# Patient Record
Sex: Male | Born: 1965 | Race: Black or African American | Hispanic: No | Marital: Single | Smoking: Current every day smoker
Health system: Southern US, Community
[De-identification: ages and names within clinical notes are randomized; demographics above are authoritative.]

## PROBLEM LIST (undated history)

## (undated) DIAGNOSIS — F17200 Nicotine dependence, unspecified, uncomplicated: Secondary | ICD-10-CM

## (undated) DIAGNOSIS — I712 Thoracic aortic aneurysm, without rupture, unspecified: Secondary | ICD-10-CM

## (undated) DIAGNOSIS — I10 Essential (primary) hypertension: Secondary | ICD-10-CM

## (undated) DIAGNOSIS — F1020 Alcohol dependence, uncomplicated: Secondary | ICD-10-CM

---

## 1998-10-14 ENCOUNTER — Emergency Department (HOSPITAL_COMMUNITY): Admission: EM | Admit: 1998-10-14 | Discharge: 1998-10-14 | Payer: Self-pay | Admitting: Emergency Medicine

## 1999-04-26 ENCOUNTER — Emergency Department (HOSPITAL_COMMUNITY): Admission: EM | Admit: 1999-04-26 | Discharge: 1999-04-26 | Payer: Self-pay | Admitting: Emergency Medicine

## 1999-07-10 ENCOUNTER — Emergency Department (HOSPITAL_COMMUNITY): Admission: EM | Admit: 1999-07-10 | Discharge: 1999-07-10 | Payer: Self-pay | Admitting: Internal Medicine

## 2001-11-28 ENCOUNTER — Emergency Department (HOSPITAL_COMMUNITY): Admission: EM | Admit: 2001-11-28 | Discharge: 2001-11-28 | Payer: Self-pay | Admitting: *Deleted

## 2002-08-13 ENCOUNTER — Emergency Department (HOSPITAL_COMMUNITY): Admission: EM | Admit: 2002-08-13 | Discharge: 2002-08-14 | Payer: Self-pay | Admitting: Emergency Medicine

## 2002-08-13 ENCOUNTER — Encounter: Payer: Self-pay | Admitting: Emergency Medicine

## 2002-12-21 ENCOUNTER — Emergency Department (HOSPITAL_COMMUNITY): Admission: EM | Admit: 2002-12-21 | Discharge: 2002-12-21 | Payer: Self-pay | Admitting: Emergency Medicine

## 2002-12-21 ENCOUNTER — Encounter: Payer: Self-pay | Admitting: Emergency Medicine

## 2003-12-25 ENCOUNTER — Emergency Department (HOSPITAL_COMMUNITY): Admission: EM | Admit: 2003-12-25 | Discharge: 2003-12-25 | Payer: Self-pay | Admitting: *Deleted

## 2004-03-01 ENCOUNTER — Emergency Department (HOSPITAL_COMMUNITY): Admission: EM | Admit: 2004-03-01 | Discharge: 2004-03-01 | Payer: Self-pay | Admitting: Emergency Medicine

## 2004-08-09 ENCOUNTER — Emergency Department (HOSPITAL_COMMUNITY): Admission: EM | Admit: 2004-08-09 | Discharge: 2004-08-09 | Payer: Self-pay | Admitting: Emergency Medicine

## 2005-01-30 ENCOUNTER — Emergency Department (HOSPITAL_COMMUNITY): Admission: EM | Admit: 2005-01-30 | Discharge: 2005-01-30 | Payer: Self-pay | Admitting: Emergency Medicine

## 2006-06-08 ENCOUNTER — Emergency Department (HOSPITAL_COMMUNITY): Admission: EM | Admit: 2006-06-08 | Discharge: 2006-06-08 | Payer: Self-pay | Admitting: Family Medicine

## 2008-03-09 ENCOUNTER — Emergency Department (HOSPITAL_COMMUNITY): Admission: EM | Admit: 2008-03-09 | Discharge: 2008-03-09 | Payer: Self-pay | Admitting: Family Medicine

## 2008-03-10 ENCOUNTER — Ambulatory Visit: Payer: Self-pay | Admitting: Emergency Medicine

## 2008-03-10 ENCOUNTER — Inpatient Hospital Stay (HOSPITAL_COMMUNITY): Admission: EM | Admit: 2008-03-10 | Discharge: 2008-03-11 | Payer: Self-pay | Admitting: Emergency Medicine

## 2008-09-28 ENCOUNTER — Emergency Department (HOSPITAL_COMMUNITY): Admission: EM | Admit: 2008-09-28 | Discharge: 2008-09-29 | Payer: Self-pay | Admitting: Emergency Medicine

## 2009-05-05 ENCOUNTER — Emergency Department (HOSPITAL_COMMUNITY): Admission: EM | Admit: 2009-05-05 | Discharge: 2009-05-05 | Payer: Self-pay | Admitting: Emergency Medicine

## 2009-09-17 ENCOUNTER — Emergency Department (HOSPITAL_COMMUNITY): Admission: EM | Admit: 2009-09-17 | Discharge: 2009-09-17 | Payer: Self-pay | Admitting: Emergency Medicine

## 2010-07-18 LAB — BRAIN NATRIURETIC PEPTIDE: Pro B Natriuretic peptide (BNP): 186 pg/mL — ABNORMAL HIGH (ref 0.0–100.0)

## 2010-07-18 LAB — DIFFERENTIAL
Basophils Absolute: 0 10*3/uL (ref 0.0–0.1)
Basophils Relative: 1 % (ref 0–1)
Eosinophils Absolute: 0 10*3/uL (ref 0.0–0.7)
Eosinophils Relative: 1 % (ref 0–5)
Lymphocytes Relative: 50 % — ABNORMAL HIGH (ref 12–46)
Lymphs Abs: 1.7 10*3/uL (ref 0.7–4.0)
Monocytes Absolute: 0.4 10*3/uL (ref 0.1–1.0)
Monocytes Relative: 13 % — ABNORMAL HIGH (ref 3–12)
Neutro Abs: 1.2 10*3/uL — ABNORMAL LOW (ref 1.7–7.7)
Neutrophils Relative %: 36 % — ABNORMAL LOW (ref 43–77)

## 2010-07-18 LAB — CBC
HCT: 37.5 % — ABNORMAL LOW (ref 39.0–52.0)
Hemoglobin: 13 g/dL (ref 13.0–17.0)
MCHC: 34.7 g/dL (ref 30.0–36.0)
MCV: 96.7 fL (ref 78.0–100.0)
Platelets: 240 10*3/uL (ref 150–400)
RBC: 3.88 MIL/uL — ABNORMAL LOW (ref 4.22–5.81)
RDW: 13.6 % (ref 11.5–15.5)
WBC: 3.5 10*3/uL — ABNORMAL LOW (ref 4.0–10.5)

## 2010-07-18 LAB — POCT CARDIAC MARKERS
CKMB, poc: 1.4 ng/mL (ref 1.0–8.0)
Myoglobin, poc: 80.5 ng/mL (ref 12–200)
Troponin i, poc: <0.05 ng/mL (ref 0.00–0.09)

## 2010-07-18 LAB — BASIC METABOLIC PANEL
BUN: 8 mg/dL (ref 6–23)
CO2: 24 mEq/L (ref 19–32)
Calcium: 9 mg/dL (ref 8.4–10.5)
Chloride: 104 mEq/L (ref 96–112)
Creatinine, Ser: 0.95 mg/dL (ref 0.4–1.5)
GFR calc Af Amer: >60 mL/min (ref >60)
GFR calc non Af Amer: >60 mL/min (ref >60)
Glucose, Bld: 90 mg/dL (ref 70–99)
Potassium: 3.8 mEq/L (ref 3.5–5.1)
Sodium: 135 mEq/L (ref 135–145)

## 2010-08-09 LAB — DIFFERENTIAL
Basophils Absolute: 0 10*3/uL (ref 0.0–0.1)
Basophils Relative: 0 % (ref 0–1)
Eosinophils Absolute: 0 10*3/uL (ref 0.0–0.7)
Eosinophils Relative: 0 % (ref 0–5)
Lymphocytes Relative: 26 % (ref 12–46)
Lymphs Abs: 1.2 10*3/uL (ref 0.7–4.0)
Monocytes Absolute: 0.4 10*3/uL (ref 0.1–1.0)
Monocytes Relative: 10 % (ref 3–12)
Neutro Abs: 3 10*3/uL (ref 1.7–7.7)
Neutrophils Relative %: 64 % (ref 43–77)

## 2010-08-09 LAB — COMPREHENSIVE METABOLIC PANEL
ALT: 20 U/L (ref 0–53)
AST: 30 U/L (ref 0–37)
Albumin: 4.4 g/dL (ref 3.5–5.2)
Alkaline Phosphatase: 57 U/L (ref 39–117)
BUN: 7 mg/dL (ref 6–23)
CO2: 28 mEq/L (ref 19–32)
Calcium: 9.7 mg/dL (ref 8.4–10.5)
Chloride: 101 mEq/L (ref 96–112)
Creatinine, Ser: 1.11 mg/dL (ref 0.4–1.5)
GFR calc Af Amer: >60 mL/min (ref >60)
GFR calc non Af Amer: >60 mL/min (ref >60)
Glucose, Bld: 101 mg/dL — ABNORMAL HIGH (ref 70–99)
Potassium: 3.5 mEq/L (ref 3.5–5.1)
Sodium: 138 mEq/L (ref 135–145)
Total Bilirubin: 0.7 mg/dL (ref 0.3–1.2)
Total Protein: 8.2 g/dL (ref 6.0–8.3)

## 2010-08-09 LAB — CBC
HCT: 41.2 % (ref 39.0–52.0)
Hemoglobin: 14.3 g/dL (ref 13.0–17.0)
MCHC: 34.6 g/dL (ref 30.0–36.0)
MCV: 97.2 fL (ref 78.0–100.0)
Platelets: 223 10*3/uL (ref 150–400)
RBC: 4.24 MIL/uL (ref 4.22–5.81)
RDW: 13.9 % (ref 11.5–15.5)
WBC: 4.6 10*3/uL (ref 4.0–10.5)

## 2010-08-09 LAB — ETHANOL: Alcohol, Ethyl (B): <5 mg/dL (ref 0–10)

## 2010-09-13 NOTE — H&P (Signed)
NAMESAVAUGHN, KARWOWSKI                ACCOUNT NO.:  [id number]   MEDICAL RECORD NO.:  [id number]          PATIENT TYPE:  INP   LOCATION:  2113                         FACILITY:  [facility]   PHYSICIAN:  Jefry H. Pollyann Kennedy, MD     DATE OF BIRTH:  Dec 20, 1965   DATE OF ADMISSION:  03/10/2008  DATE OF DISCHARGE:                              HISTORY & PHYSICAL   TIME:  5:00 a.m.   ADMISSION DIAGNOSIS:  Angioedema.   HISTORY:  This is a 45 year old with a history of having been prescribed  lisinopril yesterday at the [facility] at [facility].  He took  his first dose yesterday evening and awoke at 2 in the morning with  shortness of breath, difficulty swallowing secretions, and trouble  breathing.  He had trouble with his voice.  He called 911 and was  brought to the emergency department.  He was given antihistamine and H2  blocker and has had some improvement of symptoms.   He has no other significant past medical history.   His social history is positive for cigarette and alcohol use.   He is on no other medications.   PHYSICAL EXAMINATION:  Healthy-appearing gentleman, sitting upright in  the emergency room chair without any stridor or difficulty breathing  currently.  With forced inspiration and expiration, he is moving good  air movement without any stridor.  No palpable neck masses.  Oral cavity  and pharynx reveals edema of the left side of the soft palate and  pharyngeal wall.  His voice is clear.  He is handling secretions well.  There is no swelling of the tongue, floor of mouth or remainder of  oropharynx.   IMPRESSION:  Angiotensin-converting enzyme inhibitor angioedema, seems  to be resolving with initial medical treatment.   PLAN:  To admit to the intensive care unit for observation to make sure  that there is no advancement of the swelling and no additional airway  concerns.  We will continue on H2 and H1 blockers.  We will have him  followup as an outpatient to have  his hypertension treated with  different classes of medication.      Jefry H. Pollyann Kennedy, MD  Electronically Signed     JHR/MEDQ  D:  03/10/2008  T:  03/10/2008  Job:  [id number]

## 2011-01-31 LAB — BASIC METABOLIC PANEL
BUN: 10
BUN: 7
CO2: 26
CO2: 27
Calcium: 9.1
Calcium: 9.7
Chloride: 100
Chloride: 98
Creatinine, Ser: 1.04
Creatinine, Ser: 1.06
GFR calc Af Amer: >60
GFR calc Af Amer: >60
GFR calc non Af Amer: >60
GFR calc non Af Amer: >60
Glucose, Bld: 149 — ABNORMAL HIGH
Glucose, Bld: 152 — ABNORMAL HIGH
Potassium: 3.8
Potassium: 3.8
Sodium: 132 — ABNORMAL LOW
Sodium: 134 — ABNORMAL LOW

## 2011-01-31 LAB — GLUCOSE, CAPILLARY: Glucose-Capillary: 111 — ABNORMAL HIGH

## 2011-10-11 ENCOUNTER — Emergency Department (HOSPITAL_COMMUNITY): Payer: Medicaid Other

## 2011-10-11 ENCOUNTER — Encounter (HOSPITAL_COMMUNITY): Payer: Self-pay | Admitting: *Deleted

## 2011-10-11 ENCOUNTER — Encounter (HOSPITAL_COMMUNITY): Payer: Self-pay | Admitting: Emergency Medicine

## 2011-10-11 ENCOUNTER — Emergency Department (HOSPITAL_COMMUNITY)
Admission: EM | Admit: 2011-10-11 | Discharge: 2011-10-11 | Disposition: A | Discharge status: Home / Self Care | Payer: Medicaid Other | Attending: Emergency Medicine | Admitting: Emergency Medicine

## 2011-10-11 ENCOUNTER — Emergency Department (INDEPENDENT_AMBULATORY_CARE_PROVIDER_SITE_OTHER)
Admission: EM | Admit: 2011-10-11 | Discharge: 2011-10-11 | Disposition: A | Discharge status: Nursing Home (Includes ICF) | Payer: Medicaid Other | Source: Home / Self Care | Attending: Emergency Medicine | Admitting: Emergency Medicine

## 2011-10-11 DIAGNOSIS — R220 Localized swelling, mass and lump, head: Secondary | ICD-10-CM | POA: Insufficient documentation

## 2011-10-11 DIAGNOSIS — I10 Essential (primary) hypertension: Secondary | ICD-10-CM | POA: Insufficient documentation

## 2011-10-11 DIAGNOSIS — K090 Developmental odontogenic cysts: Secondary | ICD-10-CM | POA: Insufficient documentation

## 2011-10-11 DIAGNOSIS — R221 Localized swelling, mass and lump, neck: Secondary | ICD-10-CM | POA: Insufficient documentation

## 2011-10-11 DIAGNOSIS — L089 Local infection of the skin and subcutaneous tissue, unspecified: Secondary | ICD-10-CM

## 2011-10-11 DIAGNOSIS — B89 Unspecified parasitic disease: Secondary | ICD-10-CM

## 2011-10-11 HISTORY — DX: Essential (primary) hypertension: I10

## 2011-10-11 LAB — CBC
HCT: 41.4 % (ref 39.0–52.0)
Hemoglobin: 14.5 g/dL (ref 13.0–17.0)
MCH: 33 pg (ref 26.0–34.0)
MCHC: 35 g/dL (ref 30.0–36.0)
MCV: 94.1 fL (ref 78.0–100.0)
Platelets: 237 10*3/uL (ref 150–400)
RBC: 4.4 MIL/uL (ref 4.22–5.81)
RDW: 13.4 % (ref 11.5–15.5)
WBC: 6.8 10*3/uL (ref 4.0–10.5)

## 2011-10-11 LAB — BASIC METABOLIC PANEL
CO2: 21 mEq/L (ref 19–32)
Calcium: 10 mg/dL (ref 8.4–10.5)
GFR calc Af Amer: >90 mL/min (ref >90)
Sodium: 135 mEq/L (ref 135–145)

## 2011-10-11 LAB — DIFFERENTIAL
Basophils Absolute: 0 10*3/uL (ref 0.0–0.1)
Basophils Relative: 0 % (ref 0–1)
Eosinophils Absolute: 0 10*3/uL (ref 0.0–0.7)
Eosinophils Relative: 0 % (ref 0–5)
Lymphocytes Relative: 17 % (ref 12–46)
Lymphs Abs: 1.2 10*3/uL (ref 0.7–4.0)
Monocytes Absolute: 0.8 10*3/uL (ref 0.1–1.0)
Monocytes Relative: 11 % (ref 3–12)
Neutro Abs: 4.8 10*3/uL (ref 1.7–7.7)
Neutrophils Relative %: 72 % (ref 43–77)

## 2011-10-11 MED ORDER — OXYCODONE-ACETAMINOPHEN 5-325 MG PO TABS: 2.0000 | ORAL_TABLET | ORAL | Status: AC | PRN

## 2011-10-11 MED ORDER — IOHEXOL 300 MG/ML  SOLN
100.0000 mL | Freq: Once | INTRAMUSCULAR | Status: AC | PRN
  Administered 2011-10-11: 100 mL via INTRAVENOUS

## 2011-10-11 MED ORDER — AMOXICILLIN 500 MG PO CAPS: 500.0000 mg | ORAL_CAPSULE | Freq: Three times a day (TID) | ORAL | Status: AC

## 2011-10-11 MED ORDER — OXYCODONE-ACETAMINOPHEN 5-325 MG PO TABS
2.0000 | ORAL_TABLET | Freq: Once | ORAL | Status: AC
  Administered 2011-10-11: 2 via ORAL
  Filled 2011-10-11: qty 2

## 2011-10-11 MED ORDER — OXYCODONE-ACETAMINOPHEN 5-325 MG PO TABS: 2.0000 | ORAL_TABLET | ORAL | Status: DC | PRN

## 2011-10-11 MED ORDER — PENICILLIN V POTASSIUM 250 MG PO TABS
500.0000 mg | ORAL_TABLET | Freq: Once | ORAL | Status: AC
  Administered 2011-10-11: 500 mg via ORAL
  Filled 2011-10-11: qty 2

## 2011-10-11 MED ORDER — ONDANSETRON HCL 4 MG/2ML IJ SOLN
4.0000 mg | Freq: Once | INTRAMUSCULAR | Status: AC
  Administered 2011-10-11: 4 mg via INTRAVENOUS
  Filled 2011-10-11: qty 2

## 2011-10-11 MED ORDER — MORPHINE SULFATE 4 MG/ML IJ SOLN
4.0000 mg | Freq: Once | INTRAMUSCULAR | Status: AC
  Administered 2011-10-11: 4 mg via INTRAVENOUS
  Filled 2011-10-11: qty 1

## 2011-10-11 NOTE — Discharge Instructions (Signed)
Dental Abscess Take antibiotics and pain medications as prescribed. Call Dr. Frederik Pear office first thing in the morning to get seen as soon as possible.  Return to the ED if you develop new or worsening symptoms. A dental abscess usually starts from an infected tooth. Antibiotic medicine and pain pills can be helpful, but dental infections require the attention of a dentist. Rinse around the infected area often with salt water (a pinch of salt in 8 oz of warm water). Do not apply heat to the outside of your face. See your dentist or oral surgeon as soon as possible.  SEEK IMMEDIATE MEDICAL CARE IF:  You have increasing, severe pain that is not relieved by medicine.   You or your child has an oral temperature above 102 F (38.9 C), not controlled by medicine.   Your baby is older than 3 months with a rectal temperature of 102 F (38.9 C) or higher.   Your baby is 67 months old or younger with a rectal temperature of 100.4 F (38 C) or higher.   You develop chills, severe headache, difficulty breathing, or trouble swallowing.   You have swelling in the neck or around the eye.  Document Released: 04/17/2005 Document Revised: 04/06/2011 Document Reviewed: 09/26/2006 [facility] Patient Information 2012 [place], [place].

## 2011-10-11 NOTE — ED Provider Notes (Signed)
History   This chart was scribed for Glynn Octave, MD by Charolett Bumpers . The patient was seen in room STRE1/STRE1.    CSN: [id number]  Arrival date & time 10/11/11  1435   First MD Initiated Contact with Patient 10/11/11 1623      Chief Complaint  Patient presents with  . Facial Swelling    (Consider location/radiation/quality/duration/timing/severity/associated sxs/prior treatment) HPI Chad Salas is a 46 y.o. male who presents to the Emergency Department complaining of constant, moderate facial swelling for the past 3 days on the left side all the way down to lip and nose. Patient reports associated pain but denies any dental pain. Patient denies any fevers. Patient reports associated nausea and vomiting just PTA. Patient was sent from urgent care for CT scan. Patient reports a h/o HTN.    Past Medical History  Diagnosis Date  . Hypertension     History reviewed. No pertinent past surgical history.  History reviewed. No pertinent family history.  History  Substance Use Topics  . Smoking status: Current Everyday Smoker  . Smokeless tobacco: Not on file  . Alcohol Use: Yes     social      Review of Systems  Constitutional: Negative for fever and chills.  HENT: Positive for facial swelling. Negative for neck pain.   Respiratory: Negative for shortness of breath.   Cardiovascular: Negative for chest pain.  Gastrointestinal: Negative for nausea, vomiting and abdominal pain.  Neurological: Negative for weakness.  All other systems reviewed and are negative.    Allergies  Lisinopril  Home Medications   Current Outpatient Rx  Name Route Sig Dispense Refill  . ACETAMINOPHEN 500 MG PO TABS Oral Take 1,000 mg by mouth every 4 (four) hours as needed. For pain    . GOODY HEADACHE PO Oral Take 1 Package by mouth as needed. For pain    . HYDROCHLOROTHIAZIDE 12.5 MG PO CAPS Oral Take 12.5 mg by mouth daily.       BP 134/88  Pulse 80  Temp 100.1 F  (37.8 C) (Oral)  Resp 18  SpO2 100%  Physical Exam  Nursing note and vitals reviewed. Constitutional: He is oriented to person, place, and time. He appears well-developed and well-nourished. No distress.  HENT:  Head: Normocephalic and atraumatic.  Nose: No septal deviation or nasal septal hematoma.       No septal hematoma. Septum appears normal. TM's normal bilaterally. Left sided facial swelling. Left side of pallet, tender area of swelling with induration.   Eyes: EOM are normal.  Neck: Neck supple. No tracheal deviation present.  Cardiovascular: Normal rate and intact distal pulses.   Pulmonary/Chest: Effort normal. No respiratory distress.  Musculoskeletal: Normal range of motion.  Neurological: He is alert and oriented to person, place, and time.  Skin: Skin is warm and dry.  Psychiatric: He has a normal mood and affect. His behavior is normal.    ED Course  Procedures (including critical care time)  DIAGNOSTIC STUDIES: Oxygen Saturation is 99% on room air, normal by my interpretation.    COORDINATION OF CARE:  1630: Discussed planned course of treatment with the patient, who is agreeable at this time.  1900: Recheck: Informed patient of imaging and lab results. Discussed f/u.   Results for orders placed during the hospital encounter of 10/11/11  CBC      Component Value Range   WBC 6.8  4.0 - 10.5 K/uL   RBC 4.40  4.22 - 5.81 MIL/uL  Hemoglobin 14.5  13.0 - 17.0 g/dL   HCT 16.1  09.6 - 04.5 %   MCV 94.1  78.0 - 100.0 fL   MCH 33.0  26.0 - 34.0 pg   MCHC 35.0  30.0 - 36.0 g/dL   RDW 40.9  81.1 - 91.4 %   Platelets 237  150 - 400 K/uL  DIFFERENTIAL      Component Value Range   Neutrophils Relative 72  43 - 77 %   Neutro Abs 4.8  1.7 - 7.7 K/uL   Lymphocytes Relative 17  12 - 46 %   Lymphs Abs 1.2  0.7 - 4.0 K/uL   Monocytes Relative 11  3 - 12 %   Monocytes Absolute 0.8  0.1 - 1.0 K/uL   Eosinophils Relative 0  0 - 5 %   Eosinophils Absolute 0.0  0.0 - 0.7  K/uL   Basophils Relative 0  0 - 1 %   Basophils Absolute 0.0  0.0 - 0.1 K/uL  BASIC METABOLIC PANEL      Component Value Range   Sodium 135  135 - 145 mEq/L   Potassium 4.7  3.5 - 5.1 mEq/L   Chloride 99  96 - 112 mEq/L   CO2 21  19 - 32 mEq/L   Glucose, Bld 81  70 - 99 mg/dL   BUN 9  6 - 23 mg/dL   Creatinine, Ser 7.82  0.50 - 1.35 mg/dL   Calcium 95.6  8.4 - 21.3 mg/dL   GFR calc non Af Amer >90  >90 mL/min   GFR calc Af Amer >90  >90 mL/min     Ct Maxillofacial W/cm  10/11/2011  *RADIOLOGY REPORT*  Clinical Data: Facial Swelling for 3 days.  CT MAXILLOFACIAL WITH CONTRAST  Technique:  Multidetector CT imaging of the maxillofacial structures was performed with intravenous contrast. Multiplanar CT image reconstructions were also generated.  Contrast: OMNIPAQUE IOHEXOL 300 MG/ML  SOLN  Comparison: None.  Findings: There is a 23 x 29 x 26 mm left paramedian maxillary lesion which has eroded at this surrounding bone with some areas of eggshell like residual bony density.  The lesion has extended into the floor of the left maxillary sinus where there is moderate mucosal thickening.  The lesion appears to have arisen from the roots of the 9th and or 10th teeth, left maxillary incisors. There is minimal periapical disease of tooth 11 (left maxillary canine), but the 2 cm lesion does not appear to represent a periapical abscess. 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 Superinfection of this likely a dentigerous cyst cannot be excluded.  The mandible is intact.  Located TMJs.  Moderate sinus disease in the posterior ethmoids, greater on the left.  Frontal sinuses and sphenoid sinus clear.  IMPRESSION: 23 x 29 x 26 mm left paramedian maxillary lesion which has smoothly eroded the surrounding maxilla with some areas of eggshell like residual bone.  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 Superimposed infection of this cyst is not excluded.  Periapical abscess felt unlikely.   Original Report Authenticated By: Elsie Stain, M.D.     No diagnosis found.    MDM  Sent from urgent care with left-sided facial swelling and palate swelling for the past 2 days. No fevers, nausea or vomiting, difficulty breathing or swallowing.  No associated dental pain.  CT results discussed with Dr. Chales Salmon. Recommends antibiotics, pain medication, followup in office tomorrow. Patient instructed to call office first thing in  morning.  I personally performed the services described in this documentation, which was scribed in my presence.  The recorded information has been reviewed and considered.       Glynn Octave, MD 10/11/11 [id number]

## 2011-10-11 NOTE — ED Notes (Signed)
Pt reports that he started to have facial pain and lip swelling for the past two days. No relief from otc meds

## 2011-10-11 NOTE — ED Notes (Signed)
Pt here from [facility] with left sided facial swelling and possible abscess tooth x 4 days; pt sent here for possible CT scan

## 2011-10-11 NOTE — ED Provider Notes (Addendum)
History     CSN: [id number]  Arrival date & time 10/11/11  1253   First MD Initiated Contact with Patient 10/11/11 1346      Chief Complaint  Patient presents with  . Oral Swelling  . Facial Pain    (Consider location/radiation/quality/duration/timing/severity/associated sxs/prior treatment) HPI Comments: Patient presents urgent care reporting that the left side of his face all the way down to his lip and his nose. " its  hurting for the last 2-3 days. Patient denies any dental pain but admits of having dental problems. He denies any fevers, or feeling nauseous. Patient is describing a headache that extends to his  left for headhead, throbbing pain.  The history is provided by the patient.    Past Medical History  Diagnosis Date  . Hypertension     History reviewed. No pertinent past surgical history.  History reviewed. No pertinent family history.  History  Substance Use Topics  . Smoking status: Current Everyday Smoker  . Smokeless tobacco: Not on file  . Alcohol Use: Yes     social      Review of Systems  Constitutional: Positive for chills, appetite change and fatigue. Negative for fever, diaphoresis and unexpected weight change.  Musculoskeletal: Negative for myalgias, joint swelling and arthralgias.  Neurological: Positive for headaches. Negative for dizziness and light-headedness.    Allergies  Lisinopril  Home Medications   Current Outpatient Rx  Name Route Sig Dispense Refill  . HYDROCHLOROTHIAZIDE 12.5 MG PO CAPS Oral Take 12.5 mg by mouth daily.     . ACETAMINOPHEN 500 MG PO TABS Oral Take 1,000 mg by mouth every 4 (four) hours as needed. For pain    . GOODY HEADACHE PO Oral Take 1 Package by mouth as needed. For pain      BP 140/101  Pulse 76  Temp 99.1 F (37.3 C) (Oral)  Resp 19  SpO2 99%  Physical Exam  Nursing note and vitals reviewed. Constitutional: He is oriented to person, place, and time. He appears well-developed. He appears  distressed.  HENT:  Head:    Mouth/Throat: Uvula is midline.    Neck: Neck supple.  Musculoskeletal: He exhibits no tenderness.  Neurological: He is alert and oriented to person, place, and time.  Skin: No erythema.    ED Course  Procedures (including critical care time)  Labs Reviewed - No data to display No results found.   1. Facial infection       MDM  Patient with an extensive hard palate abnormality consistent with a regional infection possibly of dental origin. Patient has been transferred to the emergency department as it's a candidate for further imaging and characterization of this infection. Patient is afebrile.        Jimmie Molly, MD 10/11/11 1433  Jimmie Molly, MD 10/11/11 [phone]

## 2011-10-11 NOTE — ED Notes (Signed)
Pt reports having (L) sided facial swelling and pain x 4 days.  States that he has had this in the past but this time it is more painful and did not go away.  Pt denies injury.  Swelling noted to (L) side of cheek and jaw.  pts gums swollen and tender.  Pt reports increased pain with eating.  No acute distress noted.

## 2011-10-11 NOTE — ED Notes (Signed)
Report called to first nurse Morrie Sheldon, RN pt escorted to ed via shuttle

## 2013-04-13 ENCOUNTER — Encounter (HOSPITAL_COMMUNITY): Payer: Self-pay | Admitting: Emergency Medicine

## 2013-04-13 ENCOUNTER — Emergency Department (HOSPITAL_COMMUNITY)
Admission: EM | Admit: 2013-04-13 | Discharge: 2013-04-13 | Disposition: A | Discharge status: Home / Self Care | Payer: Medicaid Other | Attending: Emergency Medicine | Admitting: Emergency Medicine

## 2013-04-13 DIAGNOSIS — T783XXA Angioneurotic edema, initial encounter: Secondary | ICD-10-CM

## 2013-04-13 DIAGNOSIS — I10 Essential (primary) hypertension: Secondary | ICD-10-CM | POA: Insufficient documentation

## 2013-04-13 DIAGNOSIS — F17200 Nicotine dependence, unspecified, uncomplicated: Secondary | ICD-10-CM | POA: Insufficient documentation

## 2013-04-13 MED ORDER — DIPHENHYDRAMINE HCL 25 MG PO TABS: 50.0000 mg | ORAL_TABLET | Freq: Four times a day (QID) | ORAL | Status: DC

## 2013-04-13 MED ORDER — PREDNISONE 10 MG PO TABS: 60.0000 mg | ORAL_TABLET | Freq: Every day | ORAL | Status: DC

## 2013-04-13 NOTE — ED Notes (Signed)
Pt resting in bed talking in clear complete sentences.  No wants or need at the time per pt

## 2013-04-13 NOTE — ED Notes (Signed)
Pt states he had some burning in his lips earlier tonight, but medications given to him by EMS have reduced his SOB and pain in lips.  Pt states he feels "prettu much back to normal" at this time, but is concern about why this is happening.  Pt states he has been dealing with it for a week, but treating it with PO benadryl at home.

## 2013-04-13 NOTE — ED Notes (Signed)
Presents with angioedema to lips, hives, wheezing, began at midnight. Hx of angioedema with lisinopril, does not currently take. Reports having some mild itching at night for the last few nights but it goes away in the morning. EMS gave 50 of zantac, 125 solumedrol, 4 of zofran, 50 of benadryl, 200 NS, 0.5mg  of albuterol. With decrease to facial edema. On nebulizer at the time, respirations unlabored.

## 2013-04-13 NOTE — ED Provider Notes (Signed)
CSN: [id number]     Arrival date & time 04/13/13  0143 History   First MD Initiated Contact with Patient 04/13/13 0148     Chief Complaint  Patient presents with  . Allergic Reaction    The history is provided by the patient.   Patient reports a history of angioedema.  He previously was on lisinopril but is no longer on lisinopril.  He is brought to the emergency department because he awoke with swelling of both his upper lower lip.  He was brought to the emergency room by EMS.  He was given 50 mg of Zantac, 125 mg of Solu-Medrol, 4 mg of Zofran, 50 mg of Benadryl.  Patient and EMS reported improvement in his facial swelling on arrival.  He does intermittently take ibuprofen and Tylenol.  He did take some ibuprofen this evening.  Patient states his breathing feels much better at this time.  No difficulty swallowing or speaking.  He feels as though his face has improved.  Past Medical History  Diagnosis Date  . Hypertension    History reviewed. No pertinent past surgical history. History reviewed. No pertinent family history. History  Substance Use Topics  . Smoking status: Current Every Day Smoker  . Smokeless tobacco: Not on file  . Alcohol Use: Yes     Comment: social    Review of Systems  All other systems reviewed and are negative.    Allergies  Lisinopril  Home Medications   Current Outpatient Rx  Name  Route  Sig  Dispense  Refill  . ibuprofen (ADVIL,MOTRIN) 200 MG tablet   Oral   Take 400 mg by mouth every 6 (six) hours as needed for fever, headache or mild pain.         Marland Kitchen albuterol (PROVENTIL) (5 MG/ML) 0.5% nebulizer solution   Nebulization   Take 2.5 mg by nebulization once.         . diphenhydrAMINE (BENADRYL) 25 MG tablet   Oral   Take 2 tablets (50 mg total) by mouth every 6 (six) hours.   20 tablet   0   . diphenhydrAMINE (BENADRYL) 50 MG/ML injection   Intravenous   Inject 50 mg into the vein.         . methylPREDNISolone sodium succinate  (SOLU-MEDROL) 125 mg/2 mL injection   Intravenous   Inject 125 mg into the vein once.         . ondansetron (ZOFRAN) 4 MG/2ML SOLN injection   Intravenous   Inject 4 mg into the vein once.         . predniSONE (DELTASONE) 10 MG tablet   Oral   Take 6 tablets (60 mg total) by mouth daily.   30 tablet   0    BP 127/88  Pulse 95  Temp(Src) 98.2 F (36.8 C) (Oral)  Resp 16  SpO2 99% Physical Exam  Nursing note and vitals reviewed. Constitutional: He is oriented to person, place, and time. He appears well-developed and well-nourished.  HENT:  Head: Normocephalic and atraumatic.  Welling about his upper and his lower lip.  Lower lip seems a little bit more swollen on the left side.  Tongue is normal in size.  Uvula is midline.  Oral airway is patent.  No stridor.  Tolerating secretions.  No difficulty with phonation.  Eyes: EOM are normal.  Neck: Normal range of motion.  Cardiovascular: Normal rate, regular rhythm, normal heart sounds and intact distal pulses.   Pulmonary/Chest: Effort normal and breath  sounds normal. No respiratory distress.  Abdominal: Soft. He exhibits no distension. There is no tenderness.  Musculoskeletal: Normal range of motion.  Neurological: He is alert and oriented to person, place, and time.  Skin: Skin is warm and dry.  Psychiatric: He has a normal mood and affect. Judgment normal.    ED Course  Procedures (including critical care time) Labs Review Labs Reviewed - No data to display Imaging Review No results found.  EKG Interpretation   None       MDM   1. Angioedema of lips, initial encounter    Angioedema.  It seems that this was resolving on arrival to the emergency department.  Patient was monitored in the emergency department throughout the night and has demonstrated stability and continues to have some improvement in the swelling of his lips.  Discharge home in good condition.  No indication for admission or additional treatment  at this time.    Lyanne Co, MD 04/13/13 [phone]

## 2015-02-01 ENCOUNTER — Encounter (HOSPITAL_COMMUNITY): Payer: Self-pay | Admitting: *Deleted

## 2015-02-01 ENCOUNTER — Emergency Department (HOSPITAL_COMMUNITY)
Admission: EM | Admit: 2015-02-01 | Discharge: 2015-02-01 | Disposition: A | Discharge status: Home / Self Care | Payer: Medicaid Other | Attending: Emergency Medicine | Admitting: Emergency Medicine

## 2015-02-01 DIAGNOSIS — Z79899 Other long term (current) drug therapy: Secondary | ICD-10-CM | POA: Diagnosis not present

## 2015-02-01 DIAGNOSIS — Z720 Tobacco use: Secondary | ICD-10-CM | POA: Diagnosis not present

## 2015-02-01 DIAGNOSIS — I10 Essential (primary) hypertension: Secondary | ICD-10-CM | POA: Diagnosis not present

## 2015-02-01 DIAGNOSIS — Z7952 Long term (current) use of systemic steroids: Secondary | ICD-10-CM | POA: Insufficient documentation

## 2015-02-01 DIAGNOSIS — Z760 Encounter for issue of repeat prescription: Secondary | ICD-10-CM | POA: Diagnosis not present

## 2015-02-01 MED ORDER — HYDROCHLOROTHIAZIDE 12.5 MG PO TABS: 12.5000 mg | ORAL_TABLET | Freq: Every day | ORAL | Status: DC

## 2015-02-01 NOTE — ED Notes (Signed)
Information  From social worker :  Name of MD on Pt. Medicade card is  [employer]  [address] [place].  [address]  Tele # [phone]

## 2015-02-01 NOTE — ED Notes (Addendum)
To ED for med refill of HCTZ. States he's been out of this medicine for past few years. States he doesn't have a pcp. States he has had intermittent HA's. Denies dizziness, sob, or cp. Denies n/v/d or difficulty with urination. States he is only here for a prescription and denies HA at this time

## 2015-02-01 NOTE — Discharge Instructions (Signed)
Take your blood pressure medication as directed. Eat a low-salt diet as outlined below. Follow up with your regular doctor in 1 week for ongoing management of your blood pressure. Return to the ER for changes or worsening symptoms.   Hypertension Hypertension, commonly called high blood pressure, is when the force of blood pumping through your arteries is too strong. Your arteries are the blood vessels that carry blood from your heart throughout your body. A blood pressure reading consists of a higher number over a lower number, such as 110/72. The higher number (systolic) is the pressure inside your arteries when your heart pumps. The lower number (diastolic) is the pressure inside your arteries when your heart relaxes. Ideally you want your blood pressure below 120/80. Hypertension forces your heart to work harder to pump blood. Your arteries may become narrow or stiff. Having hypertension puts you at risk for heart disease, stroke, and other problems.  RISK FACTORS Some risk factors for high blood pressure are controllable. Others are not.  Risk factors you cannot control include:   Race. You may be at higher risk if you are African American.  Age. Risk increases with age.  Gender. Men are at higher risk than women before age 73 years. After age 52, women are at higher risk than men. Risk factors you can control include:  Not getting enough exercise or physical activity.  Being overweight.  Getting too much fat, sugar, calories, or salt in your diet.  Drinking too much alcohol. SIGNS AND SYMPTOMS Hypertension does not usually cause signs or symptoms. Extremely high blood pressure (hypertensive crisis) may cause headache, anxiety, shortness of breath, and nosebleed. DIAGNOSIS  To check if you have hypertension, your health care provider will measure your blood pressure while you are seated, with your arm held at the level of your heart. It should be measured at least twice using the same  arm. Certain conditions can cause a difference in blood pressure between your right and left arms. A blood pressure reading that is higher than normal on one occasion does not mean that you need treatment. If one blood pressure reading is high, ask your health care provider about having it checked again. TREATMENT  Treating high blood pressure includes making lifestyle changes and possibly taking medicine. Living a healthy lifestyle can help lower high blood pressure. You may need to change some of your habits. Lifestyle changes may include:  Following the DASH diet. This diet is high in fruits, vegetables, and whole grains. It is low in salt, red meat, and added sugars.  Getting at least 2 hours of brisk physical activity every week.  Losing weight if necessary.  Not smoking.  Limiting alcoholic beverages.  Learning ways to reduce stress. If lifestyle changes are not enough to get your blood pressure under control, your health care provider may prescribe medicine. You may need to take more than one. Work closely with your health care provider to understand the risks and benefits. HOME CARE INSTRUCTIONS  Have your blood pressure rechecked as directed by your health care provider.   Take medicines only as directed by your health care provider. Follow the directions carefully. Blood pressure medicines must be taken as prescribed. The medicine does not work as well when you skip doses. Skipping doses also puts you at risk for problems.   Do not smoke.   Monitor your blood pressure at home as directed by your health care provider. SEEK MEDICAL CARE IF:   You think you are  having a reaction to medicines taken.  You have recurrent headaches or feel dizzy.  You have swelling in your ankles.  You have trouble with your vision. SEEK IMMEDIATE MEDICAL CARE IF:  You develop a severe headache or confusion.  You have unusual weakness, numbness, or feel faint.  You have severe chest  or abdominal pain.  You vomit repeatedly.  You have trouble breathing. MAKE SURE YOU:   Understand these instructions.  Will watch your condition.  Will get help right away if you are not doing well or get worse. Document Released: 04/17/2005 Document Revised: 09/01/2013 Document Reviewed: 02/07/2013 [facility] Patient Information 2015 [place], [place]. This information is not intended to replace advice given to you by your health care provider. Make sure you discuss any questions you have with your health care provider.  How to Take Your Blood Pressure HOW DO I GET A BLOOD PRESSURE MACHINE?  You can buy an electronic home blood pressure machine at your local pharmacy. Insurance will sometimes cover the cost if you have a prescription.  Ask your doctor what type of machine is best for you. There are different machines for your arm and your wrist.  If you decide to buy a machine to check your blood pressure on your arm, first check the size of your arm so you can buy the right size cuff. To check the size of your arm:   Use a measuring tape that shows both inches and centimeters.   Wrap the measuring tape around the upper-middle part of your arm. You may need someone to help you measure.   Write down your arm measurement in both inches and centimeters.   To measure your blood pressure correctly, it is important to have the right size cuff.   If your arm is up to 13 inches (up to 34 centimeters), get an adult cuff size.  If your arm is 13 to 17 inches (35 to 44 centimeters), get a large adult cuff size.    If your arm is 17 to 20 inches (45 to 52 centimeters), get an adult thigh cuff.  WHAT DO THE NUMBERS MEAN?   There are two numbers that make up your blood pressure. For example: 120/80.  The first number (120 in our example) is called the "systolic pressure." It is a measure of the pressure in your blood vessels when your heart is pumping blood.  The second number (80  in our example) is called the "diastolic pressure." It is a measure of the pressure in your blood vessels when your heart is resting between beats.  Your doctor will tell you what your blood pressure should be. WHAT SHOULD I DO BEFORE I CHECK MY BLOOD PRESSURE?   Try to rest or relax for at least 30 minutes before you check your blood pressure.  Do not smoke.  Do not have any drinks with caffeine, such as:  Soda.  Coffee.  Tea.  Check your blood pressure in a quiet room.  Sit down and stretch out your arm on a table. Keep your arm at about the level of your heart. Let your arm relax.  Make sure that your legs are not crossed. HOW DO I CHECK MY BLOOD PRESSURE?  Follow the directions that came with your machine.  Make sure you remove any tight-fighting clothing from your arm or wrist. Wrap the cuff around your upper arm or wrist. You should be able to fit a finger between the cuff and your arm. If you  cannot fit a finger between the cuff and your arm, it is too tight and should be removed and rewrapped.  Some units require you to manually pump up the arm cuff.  Automatic units inflate the cuff when you press a button.  Cuff deflation is automatic in both models.  After the cuff is inflated, the unit measures your blood pressure and pulse. The readings are shown on a monitor. Hold still and breathe normally while the cuff is inflated.  Getting a reading takes less than a minute.  Some models store readings in a memory. Some provide a printout of readings. If your machine does not store your readings, keep a written record.  Take readings with you to your next visit with your doctor. Document Released: 03/30/2008 Document Revised: 09/01/2013 Document Reviewed: 06/12/2013 [facility] Patient Information 2015 [place], [place]. This information is not intended to replace advice given to you by your health care provider. Make sure you discuss any questions you have with your health care  provider.  DASH Eating Plan DASH stands for "Dietary Approaches to Stop Hypertension." The DASH eating plan is a healthy eating plan that has been shown to reduce high blood pressure (hypertension). Additional health benefits may include reducing the risk of type 2 diabetes mellitus, heart disease, and stroke. The DASH eating plan may also help with weight loss. WHAT DO I NEED TO KNOW ABOUT THE DASH EATING PLAN? For the DASH eating plan, you will follow these general guidelines:  Choose foods with a percent daily value for sodium of less than 5% (as listed on the food label).  Use salt-free seasonings or herbs instead of table salt or sea salt.  Check with your health care provider or pharmacist before using salt substitutes.  Eat lower-sodium products, often labeled as "lower sodium" or "no salt added."  Eat fresh foods.  Eat more vegetables, fruits, and low-fat dairy products.  Choose whole grains. Look for the word "whole" as the first word in the ingredient list.  Choose fish and skinless chicken or [place] more often than red meat. Limit fish, poultry, and meat to 6 oz (170 g) each day.  Limit sweets, desserts, sugars, and sugary drinks.  Choose heart-healthy fats.  Limit cheese to 1 oz (28 g) per day.  Eat more home-cooked food and less restaurant, buffet, and fast food.  Limit fried foods.  Cook foods using methods other than frying.  Limit canned vegetables. If you do use them, rinse them well to decrease the sodium.  When eating at a restaurant, ask that your food be prepared with less salt, or no salt if possible. WHAT FOODS CAN I EAT? Seek help from a dietitian for individual calorie needs. Grains Whole grain or whole wheat bread. Brown rice. Whole grain or whole wheat pasta. Quinoa, bulgur, and whole grain cereals. Low-sodium cereals. Corn or whole wheat flour tortillas. Whole grain cornbread. Whole grain crackers. Low-sodium crackers. Vegetables Fresh or frozen  vegetables (raw, steamed, roasted, or grilled). Low-sodium or reduced-sodium tomato and vegetable juices. Low-sodium or reduced-sodium tomato sauce and paste. Low-sodium or reduced-sodium canned vegetables.  Fruits All fresh, canned (in natural juice), or frozen fruits. Meat and Other Protein Products Ground beef (85% or leaner), grass-fed beef, or beef trimmed of fat. Skinless chicken or [place]. Ground chicken or [place]. Pork trimmed of fat. All fish and seafood. Eggs. Dried beans, peas, or lentils. Unsalted nuts and seeds. Unsalted canned beans. Dairy Low-fat dairy products, such as skim or 1% milk, 2% or  reduced-fat cheeses, low-fat ricotta or cottage cheese, or plain low-fat yogurt. Low-sodium or reduced-sodium cheeses. Fats and Oils Tub margarines without trans fats. Light or reduced-fat mayonnaise and salad dressings (reduced sodium). Avocado. Safflower, olive, or canola oils. Natural peanut or almond butter. Other Unsalted popcorn and pretzels. The items listed above may not be a complete list of recommended foods or beverages. Contact your dietitian for more options. WHAT FOODS ARE NOT RECOMMENDED? Grains White bread. White pasta. White rice. Refined cornbread. Bagels and croissants. Crackers that contain trans fat. Vegetables Creamed or fried vegetables. Vegetables in a cheese sauce. Regular canned vegetables. Regular canned tomato sauce and paste. Regular tomato and vegetable juices. Fruits Dried fruits. Canned fruit in light or heavy syrup. Fruit juice. Meat and Other Protein Products Fatty cuts of meat. Ribs, chicken wings, bacon, sausage, bologna, salami, chitterlings, fatback, hot dogs, bratwurst, and packaged luncheon meats. Salted nuts and seeds. Canned beans with salt. Dairy Whole or 2% milk, cream, half-and-half, and cream cheese. Whole-fat or sweetened yogurt. Full-fat cheeses or blue cheese. Nondairy creamers and whipped toppings. Processed cheese, cheese spreads, or cheese  curds. Condiments Onion and garlic salt, seasoned salt, table salt, and sea salt. Canned and packaged gravies. Worcestershire sauce. Tartar sauce. Barbecue sauce. Teriyaki sauce. Soy sauce, including reduced sodium. Steak sauce. Fish sauce. Oyster sauce. Cocktail sauce. Horseradish. Ketchup and mustard. Meat flavorings and tenderizers. Bouillon cubes. Hot sauce. Tabasco sauce. Marinades. Taco seasonings. Relishes. Fats and Oils Butter, stick margarine, lard, shortening, ghee, and bacon fat. Coconut, palm kernel, or palm oils. Regular salad dressings. Other Pickles and olives. Salted popcorn and pretzels. The items listed above may not be a complete list of foods and beverages to avoid. Contact your dietitian for more information. WHERE CAN I FIND MORE INFORMATION? National [employer], [employer], and [employer] Institute: [employer] Document Released: 04/06/2011 Document Revised: 09/01/2013 Document Reviewed: 02/19/2013 [facility] Patient Information 2015 [place], [place]. This information is not intended to replace advice given to you by your health care provider. Make sure you discuss any questions you have with your health care provider.

## 2015-02-01 NOTE — ED Provider Notes (Signed)
CSN: [id number]     Arrival date & time 02/01/15  4098 History   By signing my name below, I, Marica Otter, attest that this documentation has been prepared under the direction and in the presence of Ellyanna Holton Camprubi-Soms, PA-C. Electronically Signed: Marica Otter, ED Scribe. 02/01/2015. 11:07 AM.  Chief Complaint  Patient presents with  . Hypertension   Patient is a 49 y.o. male presenting with hypertension. The history is provided by the patient. No language interpreter was used.  Hypertension This is a chronic problem. The current episode started more than 1 week ago. The problem occurs constantly. The problem has not changed since onset.Pertinent negatives include no chest pain, no abdominal pain, no headaches and no shortness of breath. Nothing aggravates the symptoms. The symptoms are relieved by medications. He has tried nothing for the symptoms. The treatment provided no relief.   PCP: [facility] Primary HPI Comments: Chad Salas is a 49 y.o. male, with Hx of HTN and daily tobacco use, who presents to the Emergency Department for refill of HCTZ 12.5mg ; pt notes he has been out of said meds for multiple years; during triage pt's BP is 140/105. Pt denies any other associated Sx at this time. Says he sometimes gets headaches intermittently, but doesn't have one today. Pt denies fever, chills, chest pain, SOB, n/v/d/c, abd pain, dysuria, hematuria, numbness/tingling, weakness, dizziness, headache, visual disturbances. Hasn't seen his PCP for this but will call today.  Past Medical History  Diagnosis Date  . Hypertension    History reviewed. No pertinent past surgical history. History reviewed. No pertinent family history. Social History  Substance Use Topics  . Smoking status: Current Every Day Smoker  . Smokeless tobacco: None  . Alcohol Use: Yes     Comment: social    Review of Systems  Constitutional: Negative for fever and chills.       +HTN  Eyes: Negative for  visual disturbance.  Respiratory: Negative for shortness of breath.   Cardiovascular: Negative for chest pain.  Gastrointestinal: Negative for nausea, vomiting, abdominal pain, diarrhea and constipation.  Genitourinary: Negative for dysuria, hematuria and difficulty urinating.  Musculoskeletal: Negative for myalgias and arthralgias.  Skin: Negative for color change.  Allergic/Immunologic: Negative for immunocompromised state.  Neurological: Negative for dizziness, weakness, numbness and headaches.  Psychiatric/Behavioral: Negative for confusion.   10 Systems reviewed and all are negative for acute change except as noted in the HPI.  Allergies  Lisinopril  Home Medications   Prior to Admission medications   Medication Sig Start Date End Date Taking? Authorizing Provider  albuterol (PROVENTIL) (5 MG/ML) 0.5% nebulizer solution Take 2.5 mg by nebulization once.    Historical Provider, MD  diphenhydrAMINE (BENADRYL) 25 MG tablet Take 2 tablets (50 mg total) by mouth every 6 (six) hours. 04/13/13 04/16/13  Azalia Bilis, MD  diphenhydrAMINE (BENADRYL) 50 MG/ML injection Inject 50 mg into the vein.    Historical Provider, MD  hydrochlorothiazide (HYDRODIURIL) 12.5 MG tablet Take 1 tablet (12.5 mg total) by mouth daily. 02/01/15   Rhen Kawecki Camprubi-Soms, PA-C  ibuprofen (ADVIL,MOTRIN) 200 MG tablet Take 400 mg by mouth every 6 (six) hours as needed for fever, headache or mild pain.    Historical Provider, MD  methylPREDNISolone sodium succinate (SOLU-MEDROL) 125 mg/2 mL injection Inject 125 mg into the vein once.    Historical Provider, MD  ondansetron (ZOFRAN) 4 MG/2ML SOLN injection Inject 4 mg into the vein once.    Historical Provider, MD  predniSONE (DELTASONE) 10 MG  tablet Take 6 tablets (60 mg total) by mouth daily. 04/13/13   Azalia Bilis, MD   Triage Vitals: BP 140/105 mmHg  Pulse 73  Temp(Src) 98 F (36.7 C) (Oral)  Resp 17  Ht  (1.803 m)  Wt 160 lb 1.6 oz (72.621 kg)  BMI  22.34 kg/m2  SpO2 99% Physical Exam  Constitutional: He is oriented to person, place, and time. Vital signs are normal. He appears well-developed and well-nourished.  Non-toxic appearance. No distress.  Afebrile, nontoxic, NAD. Mildly hypertensive at 140/105.  HENT:  Head: Normocephalic and atraumatic.  Mouth/Throat: Oropharynx is clear and moist and mucous membranes are normal.  Eyes: Conjunctivae and EOM are normal. Right eye exhibits no discharge. Left eye exhibits no discharge.  Neck: Normal range of motion. Neck supple.  Cardiovascular: Normal rate, regular rhythm, normal heart sounds and intact distal pulses.  Exam reveals no gallop and no friction rub.   No murmur heard. Pulmonary/Chest: Effort normal and breath sounds normal. No respiratory distress. He has no decreased breath sounds. He has no wheezes. He has no rhonchi. He has no rales.  Abdominal: Soft. Normal appearance and bowel sounds are normal. He exhibits no distension. There is no tenderness. There is no rigidity, no rebound, no guarding, no CVA tenderness, no tenderness at McBurney's point and negative Murphy's sign.  Musculoskeletal: Normal range of motion.  Neurological: He is alert and oriented to person, place, and time. He has normal strength. No sensory deficit.  No focal neuro deficits.   Skin: Skin is warm, dry and intact. No rash noted.  Psychiatric: He has a normal mood and affect.  Nursing note and vitals reviewed.  ED Course  Procedures (including critical care time) DIAGNOSTIC STUDIES: Oxygen Saturation is 99% on ra, nl by my interpretation.    COORDINATION OF CARE: 11:01 AM: Discussed treatment plan which includes HCTZ referral, low salt diet, work note, and f/u with PCP with pt at bedside; patient verbalizes understanding and agrees with treatment plan.  MDM   Final diagnoses:  Essential hypertension  Medication refill    49 y.o. male here with asymptomatic HTN requesting refill of meds. States he  has intermittent headaches, not having one today. BP mildly elevated. Previously on HCTZ 12.5mg . Agreed to restart this but urged pt to f/up with PCP for ongoing management. DASH diet discussed. I explained the diagnosis and have given explicit precautions to return to the ER including for any other new or worsening symptoms. The patient understands and accepts the medical plan as it's been dictated and I have answered their questions. Discharge instructions concerning home care and prescriptions have been given. The patient is STABLE and is discharged to home in good condition.   I personally performed the services described in this documentation, which was scribed in my presence. The recorded information has been reviewed and is accurate.  BP 140/105 mmHg  Pulse 73  Temp(Src) 98 F (36.7 C) (Oral)  Resp 17  Ht  (1.803 m)  Wt 160 lb 1.6 oz (72.621 kg)  BMI 22.34 kg/m2  SpO2 99%  Meds ordered this encounter  Medications  . hydrochlorothiazide (HYDRODIURIL) 12.5 MG tablet    Sig: Take 1 tablet (12.5 mg total) by mouth daily.    Dispense:  30 tablet    Refill:  0    Order Specific Question:  Supervising Provider    Answer:  Evlyn Kanner, PA-C 02/01/15 1110  Linwood Dibbles, MD  02/05/15 0017 

## 2016-02-01 ENCOUNTER — Encounter (HOSPITAL_COMMUNITY): Payer: Self-pay | Admitting: Emergency Medicine

## 2016-02-01 ENCOUNTER — Emergency Department (HOSPITAL_COMMUNITY): Payer: Medicaid Other

## 2016-02-01 ENCOUNTER — Emergency Department (HOSPITAL_COMMUNITY)
Admission: EM | Admit: 2016-02-01 | Discharge: 2016-02-01 | Disposition: A | Discharge status: Home / Self Care | Payer: Medicaid Other | Attending: Emergency Medicine | Admitting: Emergency Medicine

## 2016-02-01 DIAGNOSIS — I10 Essential (primary) hypertension: Secondary | ICD-10-CM | POA: Diagnosis not present

## 2016-02-01 DIAGNOSIS — M79671 Pain in right foot: Secondary | ICD-10-CM

## 2016-02-01 DIAGNOSIS — Z79899 Other long term (current) drug therapy: Secondary | ICD-10-CM | POA: Insufficient documentation

## 2016-02-01 DIAGNOSIS — X58XXXA Exposure to other specified factors, initial encounter: Secondary | ICD-10-CM | POA: Diagnosis not present

## 2016-02-01 DIAGNOSIS — F17200 Nicotine dependence, unspecified, uncomplicated: Secondary | ICD-10-CM | POA: Insufficient documentation

## 2016-02-01 DIAGNOSIS — Y9361 Activity, american tackle football: Secondary | ICD-10-CM | POA: Insufficient documentation

## 2016-02-01 DIAGNOSIS — Y999 Unspecified external cause status: Secondary | ICD-10-CM | POA: Diagnosis not present

## 2016-02-01 DIAGNOSIS — Y929 Unspecified place or not applicable: Secondary | ICD-10-CM | POA: Insufficient documentation

## 2016-02-01 NOTE — Discharge Instructions (Signed)
Read the information below.  Your x-rays were negative for obvious fracture or dislocation.  You were provided an ACE wrap for support.  You can take tylenol 650mg  every 6hrs or motrin 400mg  every 6hrs for pain relief.  Ice and elevate for 20 minute increments for the next 2-3 days.  Follow up with your primary provider if symptoms persist.  You may return to the Emergency Department at any time for worsening condition or any new symptoms that concern you such as numbness, weakness, or fever.

## 2016-02-01 NOTE — ED Provider Notes (Signed)
[facility] Provider Note   CSN: [id number] Arrival date & time: 02/01/16  0945     History   Chief Complaint Chief Complaint  Patient presents with  . Foot Pain    HPI Chad Salas is a 50 y.o. male.  Chad Salas is a 50 y.o. Male with h/o HTN presents to ED with complaint of right foot pain. He reports he was playing football with his son on Sunday when he inverted his right ankle. He has associated swelling and soreness (2-3/10) at the lateral aspect of his right foot that has progressively improved. He performed epsom salt soaks with relief of pain. He denies fever, warmth, color change, numbness or weakness. He is able to ambulate. He presents today because work wanted him to be evaluated.        Past Medical History:  Diagnosis Date  . Hypertension     There are no active problems to display for this patient.   History reviewed. No pertinent surgical history.     Home Medications    Prior to Admission medications   Medication Sig Start Date End Date Taking? Authorizing Provider  albuterol (PROVENTIL) (5 MG/ML) 0.5% nebulizer solution Take 2.5 mg by nebulization once.    Historical Provider, MD  diphenhydrAMINE (BENADRYL) 25 MG tablet Take 2 tablets (50 mg total) by mouth every 6 (six) hours. 04/13/13 04/16/13  Azalia BilisKevin Campos, MD  diphenhydrAMINE (BENADRYL) 50 MG/ML injection Inject 50 mg into the vein.    Historical Provider, MD  hydrochlorothiazide (HYDRODIURIL) 12.5 MG tablet Take 1 tablet (12.5 mg total) by mouth daily. 02/01/15   Mercedes Camprubi-Soms, PA-C  ibuprofen (ADVIL,MOTRIN) 200 MG tablet Take 400 mg by mouth every 6 (six) hours as needed for fever, headache or mild pain.    Historical Provider, MD  methylPREDNISolone sodium succinate (SOLU-MEDROL) 125 mg/2 mL injection Inject 125 mg into the vein once.    Historical Provider, MD  ondansetron (ZOFRAN) 4 MG/2ML SOLN injection Inject 4 mg into the vein once.    Historical Provider, MD    predniSONE (DELTASONE) 10 MG tablet Take 6 tablets (60 mg total) by mouth daily. 04/13/13   Azalia BilisKevin Campos, MD    Family History No family history on file.  Social History Social History  Substance Use Topics  . Smoking status: Current Every Day Smoker  . Smokeless tobacco: Not on file  . Alcohol use Yes     Comment: social     Allergies   Lisinopril   Review of Systems Review of Systems  Constitutional: Negative for fever.  Musculoskeletal: Positive for arthralgias and joint swelling.  Skin: Negative for color change and wound.  Neurological: Negative for weakness and numbness.     Physical Exam Updated Vital Signs BP 131/96 (BP Location: Right Arm)   Pulse 92   Temp 98.3 F (36.8 C) (Oral)   Resp 18   Ht 5\' 11"  (1.803 m)   Wt 75.8 kg   SpO2 99%   BMI 23.29 kg/m   Physical Exam  Constitutional: He appears well-developed and well-nourished. No distress.  HENT:  Head: Normocephalic and atraumatic.  Eyes: Conjunctivae are normal. No scleral icterus.  Neck: Normal range of motion.  Pulmonary/Chest: Effort normal. No respiratory distress.  Musculoskeletal:  No obvious deformity, swelling, warmth, or discoloration. Mild TTP of 5th metatarsal. No TTP at ankle. No TTP at medial or lateral malleolus. ROM, strength, sensation intact. 2+ DP pulses. Patient is ambulatory.   Neurological: He is alert.  Skin: Skin is warm and dry. He is not diaphoretic.  Psychiatric: He has a normal mood and affect. His behavior is normal.     ED Treatments / Results  Labs (all labs ordered are listed, but only abnormal results are displayed) Labs Reviewed - No data to display  EKG  EKG Interpretation None       Radiology Dg Foot Complete Right  Result Date: 02/01/2016 CLINICAL DATA:  Injury. EXAM: RIGHT FOOT COMPLETE - 3+ VIEW COMPARISON:  08/09/2004. FINDINGS: No acute bony or joint abnormality identified. No evidence of fracture or dislocation. IMPRESSION: No acute  abnormality. Electronically Signed   By: Maisie Fus  Register   On: 02/01/2016 10:41    Procedures Procedures (including critical care time)  Medications Ordered in ED Medications - No data to display   Initial Impression / Assessment and Plan / ED Course  I have reviewed the triage vital signs and the nursing notes.  Pertinent labs & imaging results that were available during my care of the patient were reviewed by me and considered in my medical decision making (see chart for details).  Clinical Course  Value Comment By Time  DG Foot Complete Right No obvious fracture or dislocation.  Lona Kettle, [place] 10/03 1100    Patient presents to ED with complaint of right foot pain. Patient is afebrile and non-toxic appearing in NAD. Vital signs remarkable for mildly elevated BP, h/o HTN, otherwise stable. Physical exam remarkable for mild TTP of right 5th metatarsal, no obvious deformity or discoloration. Neurovascularly intact. Patient is ambulatory. X-ray negative for obvious fracture or dislocation. ACE wrap provided. Symptomatic management to include RICE and tylenol/motrin for pain control discussed. Follow up with PCP if sxs persist, [facility] contact information provided. Return precautions given. Pt voiced understanding and is agreeable.     Final Clinical Impressions(s) / ED Diagnoses   Final diagnoses:  Right foot pain    New Prescriptions Discharge Medication List as of 02/01/2016 11:40 AM       Lona Kettle, PA-C 02/02/16 3244    Gwyneth Sprout, MD 02/04/16 2327

## 2016-02-01 NOTE — ED Triage Notes (Signed)
Patient states R foot pain after he "sprained when I was playing around.  I have broke it before".  Patient denies other problems.

## 2016-02-01 NOTE — ED Notes (Signed)
[facility] 58 COUPON GIVEN

## 2016-02-22 ENCOUNTER — Encounter (HOSPITAL_COMMUNITY): Payer: Self-pay | Admitting: Emergency Medicine

## 2016-02-22 ENCOUNTER — Emergency Department (HOSPITAL_COMMUNITY)
Admission: EM | Admit: 2016-02-22 | Discharge: 2016-02-22 | Disposition: A | Discharge status: Home / Self Care | Payer: Medicaid Other | Attending: Emergency Medicine | Admitting: Emergency Medicine

## 2016-02-22 DIAGNOSIS — Z79899 Other long term (current) drug therapy: Secondary | ICD-10-CM | POA: Insufficient documentation

## 2016-02-22 DIAGNOSIS — I10 Essential (primary) hypertension: Secondary | ICD-10-CM | POA: Diagnosis not present

## 2016-02-22 DIAGNOSIS — F17200 Nicotine dependence, unspecified, uncomplicated: Secondary | ICD-10-CM | POA: Insufficient documentation

## 2016-02-22 LAB — COMPREHENSIVE METABOLIC PANEL
ALBUMIN: 4.3 g/dL (ref 3.5–5.0)
ALK PHOS: 74 U/L (ref 38–126)
ALT: 26 U/L (ref 17–63)
AST: 49 U/L — ABNORMAL HIGH (ref 15–41)
Anion gap: 12 (ref 5–15)
BUN: 6 mg/dL (ref 6–20)
CALCIUM: 9.7 mg/dL (ref 8.9–10.3)
CHLORIDE: 101 mmol/L (ref 101–111)
CO2: 22 mmol/L (ref 22–32)
CREATININE: 0.92 mg/dL (ref 0.61–1.24)
GFR calc Af Amer: >60 mL/min (ref >60)
GFR calc non Af Amer: >60 mL/min (ref >60)
GLUCOSE: 93 mg/dL (ref 65–99)
Potassium: 4.2 mmol/L (ref 3.5–5.1)
SODIUM: 135 mmol/L (ref 135–145)
Total Bilirubin: 0.8 mg/dL (ref 0.3–1.2)
Total Protein: 8 g/dL (ref 6.5–8.1)

## 2016-02-22 LAB — CBC
HCT: 43.5 % (ref 39.0–52.0)
HEMOGLOBIN: 15.1 g/dL (ref 13.0–17.0)
MCH: 33.2 pg (ref 26.0–34.0)
MCHC: 34.7 g/dL (ref 30.0–36.0)
MCV: 95.6 fL (ref 78.0–100.0)
PLATELETS: 257 10*3/uL (ref 150–400)
RBC: 4.55 MIL/uL (ref 4.22–5.81)
RDW: 12.8 % (ref 11.5–15.5)
WBC: 4.4 10*3/uL (ref 4.0–10.5)

## 2016-02-22 NOTE — Discharge Instructions (Signed)
Please recheck with your primary care doctor asap.  Stop smoking and limit alcohol intake.

## 2016-02-22 NOTE — ED Triage Notes (Signed)
Pt here for htn noted yesterday when had some dizziness; pt sts not taking htn meds x 6 months

## 2016-02-22 NOTE — ED Notes (Signed)
NO chest pain but complaining of a headache for the past few days

## 2016-02-22 NOTE — ED Provider Notes (Signed)
[facility] Provider [facility]   CSN: [id number] Arrival date & time: 02/22/16  1045     History   Chief Complaint Chief Complaint  Patient presents with  . Hypertension    HPI BLONG BUSK is a 50 y.o. male.  HPI  50 year old man history of hypertension not taking his medication presents today stating that he had a headache yesterday and his systolic blood pressure was noted to be 140. He states that they told him at work that this could be due to his hypotension. His headache has essentially resolved today. It was moderate and was not sudden in onset. It was diffuse in nature. He has not had any vomiting, fever, or neck pain. Blood pressure today on arrival was 122/94. He denies any vision changes, chest pain, or dyspnea. He states that he does have primary care to follow-up with. He works as a Associate Professor [employer]  Past Medical History:  Diagnosis Date  . Hypertension     There are no active problems to display for this patient.   History reviewed. No pertinent surgical history.     Home Medications    Prior to Admission medications   Medication Sig Start Date End Date Taking? Authorizing Provider  albuterol (PROVENTIL) (5 MG/ML) 0.5% nebulizer solution Take 2.5 mg by nebulization once.    Historical Provider, MD  diphenhydrAMINE (BENADRYL) 25 MG tablet Take 2 tablets (50 mg total) by mouth every 6 (six) hours. 04/13/13 04/16/13  Azalia Bilis, MD  diphenhydrAMINE (BENADRYL) 50 MG/ML injection Inject 50 mg into the vein.    Historical Provider, MD  hydrochlorothiazide (HYDRODIURIL) 12.5 MG tablet Take 1 tablet (12.5 mg total) by mouth daily. 02/01/15   Mercedes Camprubi-Soms, PA-C  ibuprofen (ADVIL,MOTRIN) 200 MG tablet Take 400 mg by mouth every 6 (six) hours as needed for fever, headache or mild pain.    Historical Provider, MD  methylPREDNISolone sodium succinate (SOLU-MEDROL) 125 mg/2 mL injection Inject 125 mg into the vein once.    Historical Provider, MD    ondansetron (ZOFRAN) 4 MG/2ML SOLN injection Inject 4 mg into the vein once.    Historical Provider, MD  predniSONE (DELTASONE) 10 MG tablet Take 6 tablets (60 mg total) by mouth daily. 04/13/13   Azalia Bilis, MD    Family History History reviewed. No pertinent family history.  Social History Social History  Substance Use Topics  . Smoking status: Current Every Day Smoker  . Smokeless tobacco: Never Used  . Alcohol use Yes     Comment: social     Allergies   Lisinopril   Review of Systems Review of Systems  All other systems reviewed and are negative.    Physical Exam Updated Vital Signs BP 122/94   Pulse 102   Temp 98.9 F (37.2 C) (Oral)   Resp 18   SpO2 99%   Physical Exam  Constitutional: He is oriented to person, place, and time. He appears well-developed and well-nourished.  HENT:  Head: Normocephalic and atraumatic.  Eyes: Conjunctivae and EOM are normal. Pupils are equal, round, and reactive to light.  Neck: Normal range of motion.  Cardiovascular: Normal rate and regular rhythm.   Pulmonary/Chest: Effort normal and breath sounds normal.  Abdominal: Bowel sounds are normal.  Musculoskeletal: Normal range of motion.  Neurological: He is alert and oriented to person, place, and time. He has normal reflexes. No cranial nerve deficit. Coordination normal.  Skin: Skin is warm and dry.  Psychiatric: He has a normal mood and  affect.  Nursing note and vitals reviewed.    ED Treatments / Results  Labs (all labs ordered are listed, but only abnormal results are displayed) Labs Reviewed  COMPREHENSIVE METABOLIC PANEL - Abnormal; Notable for the following:       Result Value   AST 49 (*)    All other components within normal limits  CBC  URINALYSIS, ROUTINE W REFLEX MICROSCOPIC (NOT AT [facility])    EKG  EKG Interpretation None       Radiology No results found.  Procedures Procedures (including critical care time)  Medications Ordered in  ED Medications - No data to display   Initial Impression / Assessment and Plan / ED Course  I have reviewed the triage vital signs and the nursing notes.  Pertinent labs & imaging results that were available during my care of the patient were reviewed by me and considered in my medical decision making (see chart for details).  Clinical Course    Patient with bp 131/96 on my evaluation.  Discussed life style modifications including alcohol cessation, smoking cessation, diet, and exercise. Discussed with patient that he should follow-up with primary care and have blood pressure reevaluated and discuss whether or not to restart medications. It is unclear how long he has been off his medications exactly what dose he has been on. Patient is advised of return precautions including sudden severe headache, neurological deficits. Patient voices understanding and agrees to follow with primary care physician. Final Clinical Impressions(s) / ED Diagnoses   Final diagnoses:  Hypertension, unspecified type    New Prescriptions New Prescriptions   No medications on file     Margarita Grizzleanielle Gaylynn Seiple, MD 02/22/16 1235

## 2016-02-22 NOTE — ED Notes (Signed)
Papers reviewed with patient and he verbalizes understanding and intent to follow up  

## 2016-04-04 ENCOUNTER — Encounter (HOSPITAL_COMMUNITY): Payer: Self-pay | Admitting: Emergency Medicine

## 2016-04-04 ENCOUNTER — Ambulatory Visit (INDEPENDENT_AMBULATORY_CARE_PROVIDER_SITE_OTHER): Payer: Medicaid Other

## 2016-04-04 ENCOUNTER — Ambulatory Visit (HOSPITAL_COMMUNITY)
Admission: EM | Admit: 2016-04-04 | Discharge: 2016-04-04 | Disposition: A | Discharge status: Home / Self Care | Payer: Medicaid Other | Attending: Family Medicine | Admitting: Family Medicine

## 2016-04-04 DIAGNOSIS — J40 Bronchitis, not specified as acute or chronic: Secondary | ICD-10-CM | POA: Diagnosis not present

## 2016-04-04 MED ORDER — GUAIFENESIN-CODEINE 100-10 MG/5ML PO SYRP: 10.0000 mL | ORAL_SOLUTION | Freq: Four times a day (QID) | ORAL | 0 refills | Status: DC | PRN

## 2016-04-04 MED ORDER — DOXYCYCLINE HYCLATE 100 MG PO CAPS: 100.0000 mg | ORAL_CAPSULE | Freq: Two times a day (BID) | ORAL | 0 refills | Status: DC

## 2016-04-04 NOTE — Discharge Instructions (Signed)
Take all of medicine, drink lots of fluids, no more smoking, see your doctor if further problems  °

## 2016-04-04 NOTE — ED Provider Notes (Signed)
[facility]    CSN: [id number] Arrival date & time: 04/04/16  1109     History   Chief Complaint Chief Complaint  Patient presents with  . URI    HPI Chad Salas is a 50 y.o. male.   The history is provided by the patient.  URI  Presenting symptoms: congestion, cough, fever and rhinorrhea   Severity:  Mild Onset quality:  Gradual Duration:  1 week Progression:  Unchanged Chronicity:  Recurrent Ineffective treatments:  OTC medications Associated symptoms: myalgias   Risk factors: sick contacts   Risk factors: no recent illness     Past Medical History:  Diagnosis Date  . Hypertension     There are no active problems to display for this patient.   History reviewed. No pertinent surgical history.     Home Medications    Prior to Admission medications   Medication Sig Start Date End Date Taking? Authorizing Provider  albuterol (PROVENTIL) (5 MG/ML) 0.5% nebulizer solution Take 2.5 mg by nebulization once.    Historical Provider, MD  diphenhydrAMINE (BENADRYL) 25 MG tablet Take 2 tablets (50 mg total) by mouth every 6 (six) hours. 04/13/13 04/16/13  Azalia BilisKevin Campos, MD  diphenhydrAMINE (BENADRYL) 50 MG/ML injection Inject 50 mg into the vein.    Historical Provider, MD  hydrochlorothiazide (HYDRODIURIL) 12.5 MG tablet Take 1 tablet (12.5 mg total) by mouth daily. Patient not taking: Reported on 04/04/2016 02/01/15   Mercedes Camprubi-Soms, PA-C  ibuprofen (ADVIL,MOTRIN) 200 MG tablet Take 400 mg by mouth every 6 (six) hours as needed for fever, headache or mild pain.    Historical Provider, MD  methylPREDNISolone sodium succinate (SOLU-MEDROL) 125 mg/2 mL injection Inject 125 mg into the vein once.    Historical Provider, MD  ondansetron (ZOFRAN) 4 MG/2ML SOLN injection Inject 4 mg into the vein once.    Historical Provider, MD  predniSONE (DELTASONE) 10 MG tablet Take 6 tablets (60 mg total) by mouth daily. Patient not taking: Reported on 04/04/2016  04/13/13   Azalia BilisKevin Campos, MD    Family History No family history on file.  Social History Social History  Substance Use Topics  . Smoking status: Current Every Day Smoker  . Smokeless tobacco: Never Used  . Alcohol use Yes     Comment: social     Allergies   Lisinopril   Review of Systems Review of Systems  Constitutional: Positive for fever.  HENT: Positive for congestion, postnasal drip and rhinorrhea.   Respiratory: Positive for cough, chest tightness and shortness of breath.   Cardiovascular: Negative.   Gastrointestinal: Negative.   Musculoskeletal: Positive for myalgias.  All other systems reviewed and are negative.    Physical Exam Triage Vital Signs ED Triage Vitals  Enc Vitals Group     BP 04/04/16 1226 (!) 145/102     Pulse Rate 04/04/16 1226 74     Resp 04/04/16 1226 22     Temp 04/04/16 1226 98.9 F (37.2 C)     Temp Source 04/04/16 1226 Oral     SpO2 04/04/16 1226 99 %     Weight --      Height --      Head Circumference --      Peak Flow --      Pain Score 04/04/16 1225 7     Pain Loc --      Pain Edu? --      Excl. in GC? --    No data found.  Updated Vital Signs BP (!) 145/102 (BP Location: Left Arm)   Pulse 74   Temp 98.9 F (37.2 C) (Oral)   Resp 22   SpO2 99%   Visual Acuity Right Eye Distance:   Left Eye Distance:   Bilateral Distance:    Right Eye Near:   Left Eye Near:    Bilateral Near:     Physical Exam  Constitutional: He is oriented to person, place, and time. He appears well-developed and well-nourished. He appears distressed.  HENT:  Right Ear: External ear normal.  Left Ear: External ear normal.  Mouth/Throat: Oropharynx is clear and moist.  Eyes: Pupils are equal, round, and reactive to light.  Neck: Normal range of motion. Neck supple.  Cardiovascular: Normal rate and regular rhythm.   Pulmonary/Chest: Effort normal. He has rales.  Neurological: He is alert and oriented to person, place, and time.  Skin:  Skin is warm and dry.  Nursing note and vitals reviewed.    UC Treatments / Results  Labs (all labs ordered are listed, but only abnormal results are displayed) Labs Reviewed - No data to display  EKG  EKG Interpretation None       Radiology No results found. X-rays reviewed and report per radiologist.  Procedures Procedures (including critical care time)  Medications Ordered in UC Medications - No data to display   Initial Impression / Assessment and Plan / UC Course  I have reviewed the triage vital signs and the nursing notes.  Pertinent labs & imaging results that were available during my care of the patient were reviewed by me and considered in my medical decision making (see chart for details).  Clinical Course       Final Clinical Impressions(s) / UC Diagnoses   Final diagnoses:  None    New Prescriptions New Prescriptions   No medications on file     Linna HoffJames D Kindl, MD 04/04/16 1407

## 2016-04-04 NOTE — ED Triage Notes (Addendum)
Feeling weak, hot and cold chills.  Feels like "a ball of fire " in throat.  Patient has had symptoms for a week.  Unknown fever.  Patient reports vomiting x 1 today.  Patient speaks of a lot of congestion.

## 2016-05-08 ENCOUNTER — Ambulatory Visit (HOSPITAL_COMMUNITY)
Admission: EM | Admit: 2016-05-08 | Discharge: 2016-05-08 | Disposition: A | Discharge status: Home / Self Care | Payer: Medicaid Other | Attending: Emergency Medicine | Admitting: Emergency Medicine

## 2016-05-08 ENCOUNTER — Encounter (HOSPITAL_COMMUNITY): Payer: Self-pay | Admitting: Emergency Medicine

## 2016-05-08 DIAGNOSIS — T6291XA Toxic effect of unspecified noxious substance eaten as food, accidental (unintentional), initial encounter: Secondary | ICD-10-CM

## 2016-05-08 DIAGNOSIS — IMO0001 Reserved for inherently not codable concepts without codable children: Secondary | ICD-10-CM

## 2016-05-08 MED ORDER — ONDANSETRON HCL 4 MG PO TABS: 4.0000 mg | ORAL_TABLET | Freq: Three times a day (TID) | ORAL | 0 refills | Status: DC | PRN

## 2016-05-08 NOTE — ED Provider Notes (Signed)
[facility]    CSN: [id number] Arrival date & time: 05/08/16  1013     History   Chief Complaint Chief Complaint  Patient presents with  . Abdominal Cramping    HPI Chad Salas is a 51 y.o. male.   HPI  He is a 51 year old man here for evaluation of vomiting and diarrhea. His symptoms started Friday night after eating at a restaurant. He reports abdominal cramping, nausea, vomiting, and watery diarrhea. He states things are little bit better last 24 hours, but he continues to have diarrhea 4-5 times a day. He denies any blood in the stool. He continues to have some retching. He is able to tolerate liquids. No fevers. 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  Past Medical History:  Diagnosis Date  . Hypertension     There are no active problems to display for this patient.   History reviewed. No pertinent surgical history.     Home Medications    Prior to Admission medications   Medication Sig Start Date End Date Taking? Authorizing Provider  ondansetron (ZOFRAN) 4 MG tablet Take 1 tablet (4 mg total) by mouth every 8 (eight) hours as needed for nausea or vomiting. 05/08/16   Charm Rings, MD    Family History History reviewed. No pertinent family history.  Social History Social History  Substance Use Topics  . Smoking status: Current Every Day Smoker  . Smokeless tobacco: Never Used  . Alcohol use Yes     Comment: social     Allergies   Lisinopril   Review of Systems Review of Systems As in history of present illness  Physical Exam Triage Vital Signs ED Triage Vitals  Enc Vitals Group     BP 05/08/16 1110 142/98     Pulse Rate 05/08/16 1110 84     Resp 05/08/16 1110 18     Temp 05/08/16 1110 98.3 F (36.8 C)     Temp Source 05/08/16 1110 Oral     SpO2 05/08/16 1110 100 %     Weight --      Height --      Head Circumference --      Peak Flow --      Pain Score 05/08/16 1109 6     Pain Loc --      Pain Edu? --      Excl. in  GC? --    No data found.   Updated Vital Signs BP 142/98 (BP Location: Right Arm)   Pulse 84   Temp 98.3 F (36.8 C) (Oral)   Resp 18   SpO2 100%   Visual Acuity Right Eye Distance:   Left Eye Distance:   Bilateral Distance:    Right Eye Near:   Left Eye Near:    Bilateral Near:     Physical Exam  Constitutional: He is oriented to person, place, and time. He appears well-developed and well-nourished. No distress.  Cardiovascular: Normal rate, regular rhythm and normal heart sounds.   No murmur heard. Pulmonary/Chest: Effort normal and breath sounds normal. No respiratory distress. He has no wheezes. He has no rales.  Abdominal: Soft. He exhibits no distension. There is no tenderness. There is no rebound and no guarding.  Hyperactive bowel sounds  Neurological: He is alert and oriented to person, place, and time.     UC Treatments / Results  Labs (all labs ordered are listed, but only abnormal results are displayed) Labs Reviewed - No  data to display  EKG  EKG Interpretation None       Radiology No results found.  Procedures Procedures (including critical care time)  Medications Ordered in UC Medications - No data to display   Initial Impression / Assessment and Plan / UC Course  I have reviewed the triage vital signs and the nursing notes.  Pertinent labs & imaging results that were available during my care of the patient were reviewed by me and considered in my medical decision making (see chart for details).  Clinical Course     Symptomatic treatment with Zofran as needed for nausea and vomiting. Okay to take 1 OTC Imodium daily. He is not contagious. Return to work note provided. Follow up as needed.  Final Clinical Impressions(s) / UC Diagnoses   Final diagnoses:  Food poisoning, accidental or unintentional, initial encounter    New Prescriptions New Prescriptions   ONDANSETRON (ZOFRAN) 4 MG TABLET    Take 1 tablet (4 mg total) by mouth  every 8 (eight) hours as needed for nausea or vomiting.     Charm RingsErin J Shequilla Goodgame, MD 05/08/16 [phone]

## 2016-05-08 NOTE — ED Triage Notes (Signed)
[facility] with a complaint of abdominal cramping with N/V/D x 5 days after eating at a [place] restaurant.

## 2016-05-08 NOTE — Discharge Instructions (Signed)
It sounds like you had food poisoning. Things should continue to improve over the next 2-3 days. Use the Zofran every 8 hours as needed for nausea or vomiting. You can take 1 over-the-counter Imodium a day to help slow down the diarrhea. Make sure you are drinking plenty of liquids. Stick with a bland diet for the next 2-3 days. Follow-up as needed.

## 2016-08-11 ENCOUNTER — Encounter (HOSPITAL_COMMUNITY): Payer: Self-pay | Admitting: Emergency Medicine

## 2016-08-11 ENCOUNTER — Ambulatory Visit (HOSPITAL_COMMUNITY)
Admission: EM | Admit: 2016-08-11 | Discharge: 2016-08-11 | Disposition: A | Discharge status: Home / Self Care | Payer: Medicaid Other | Attending: Family Medicine | Admitting: Family Medicine

## 2016-08-11 DIAGNOSIS — L509 Urticaria, unspecified: Secondary | ICD-10-CM

## 2016-08-11 MED ORDER — METHYLPREDNISOLONE 4 MG PO TBPK: ORAL_TABLET | ORAL | 0 refills | Status: DC

## 2016-08-11 MED ORDER — CETIRIZINE HCL 10 MG PO TABS: 10.0000 mg | ORAL_TABLET | Freq: Every day | ORAL | 1 refills | Status: DC

## 2016-08-11 NOTE — ED Triage Notes (Signed)
PT reports random hives all over body for years. PT takes benadryl and they resolve. PT reports it has not happened in three years, but it has happened 2 times in the last three days. PT reports no new environmental factors.

## 2017-02-06 IMAGING — DX DG CHEST 2V
2 series · 2 of 2 positions shown · non-contrast
Comparison: 09/17/2009

CLINICAL DATA: Cough, body aches and chills over the last week.
Smoking history.

EXAM:
CHEST  2 VIEW

[chest pa]
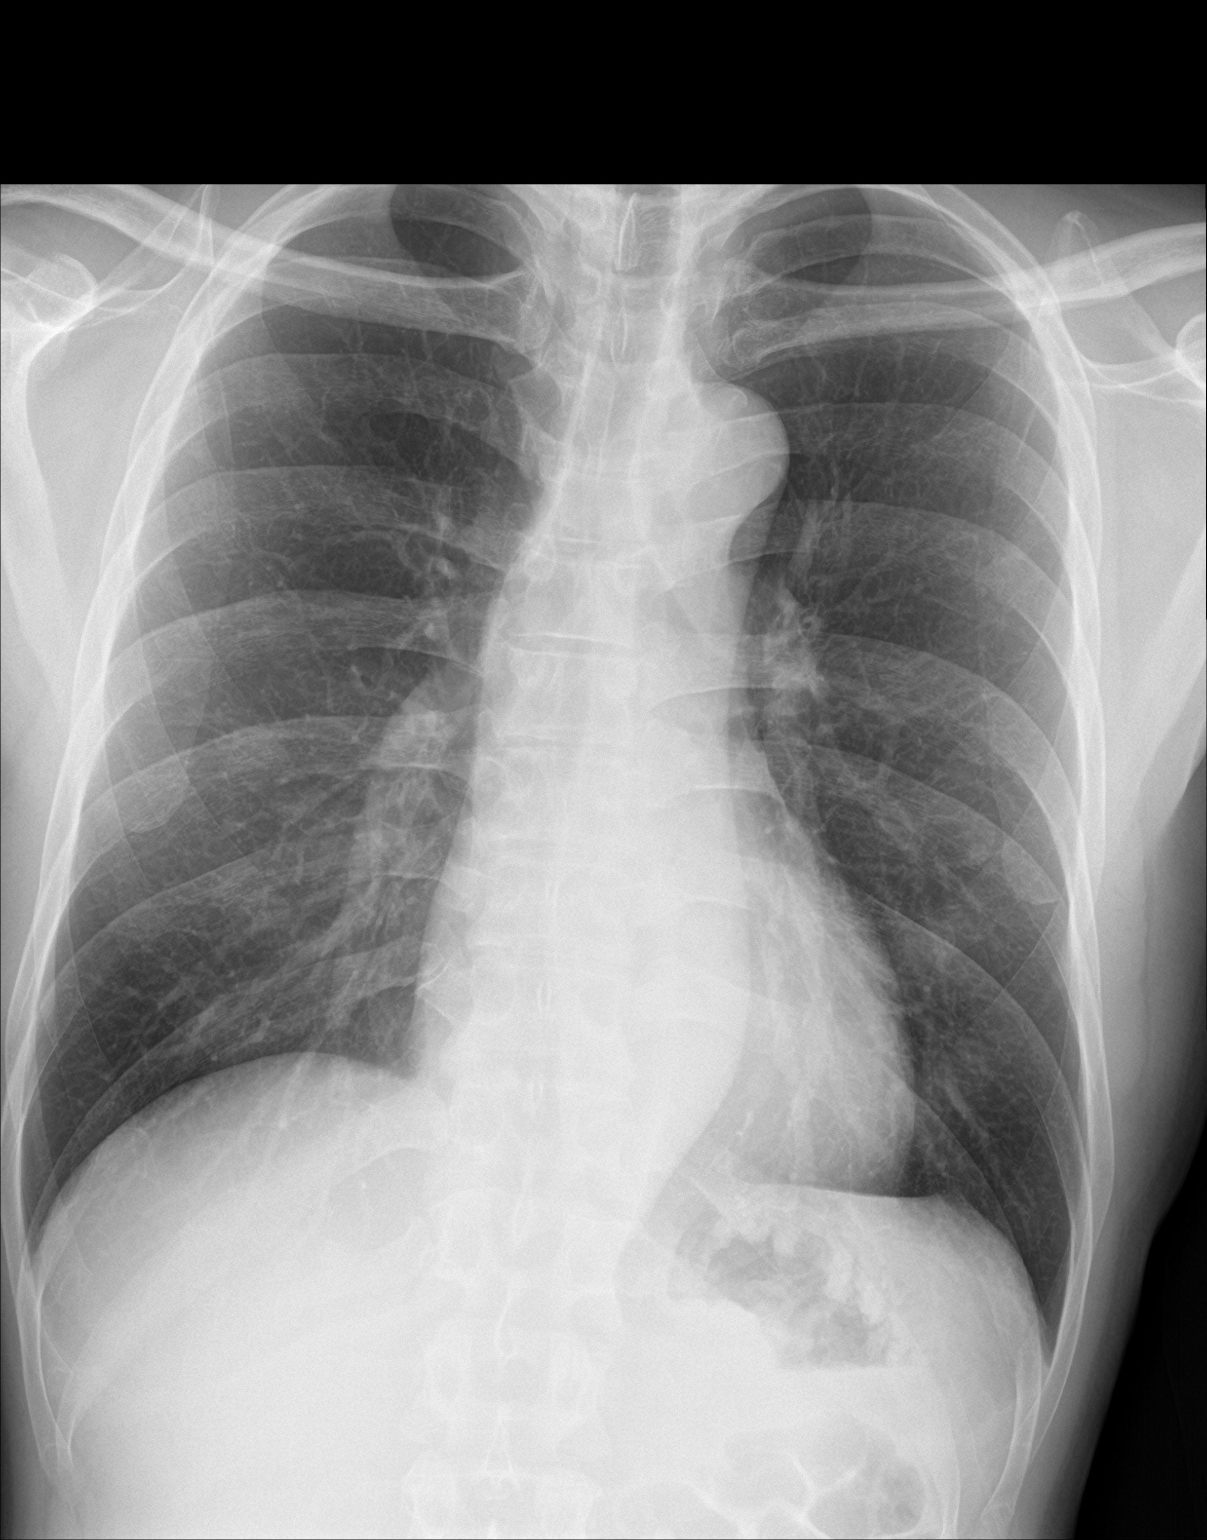

[chest lat]
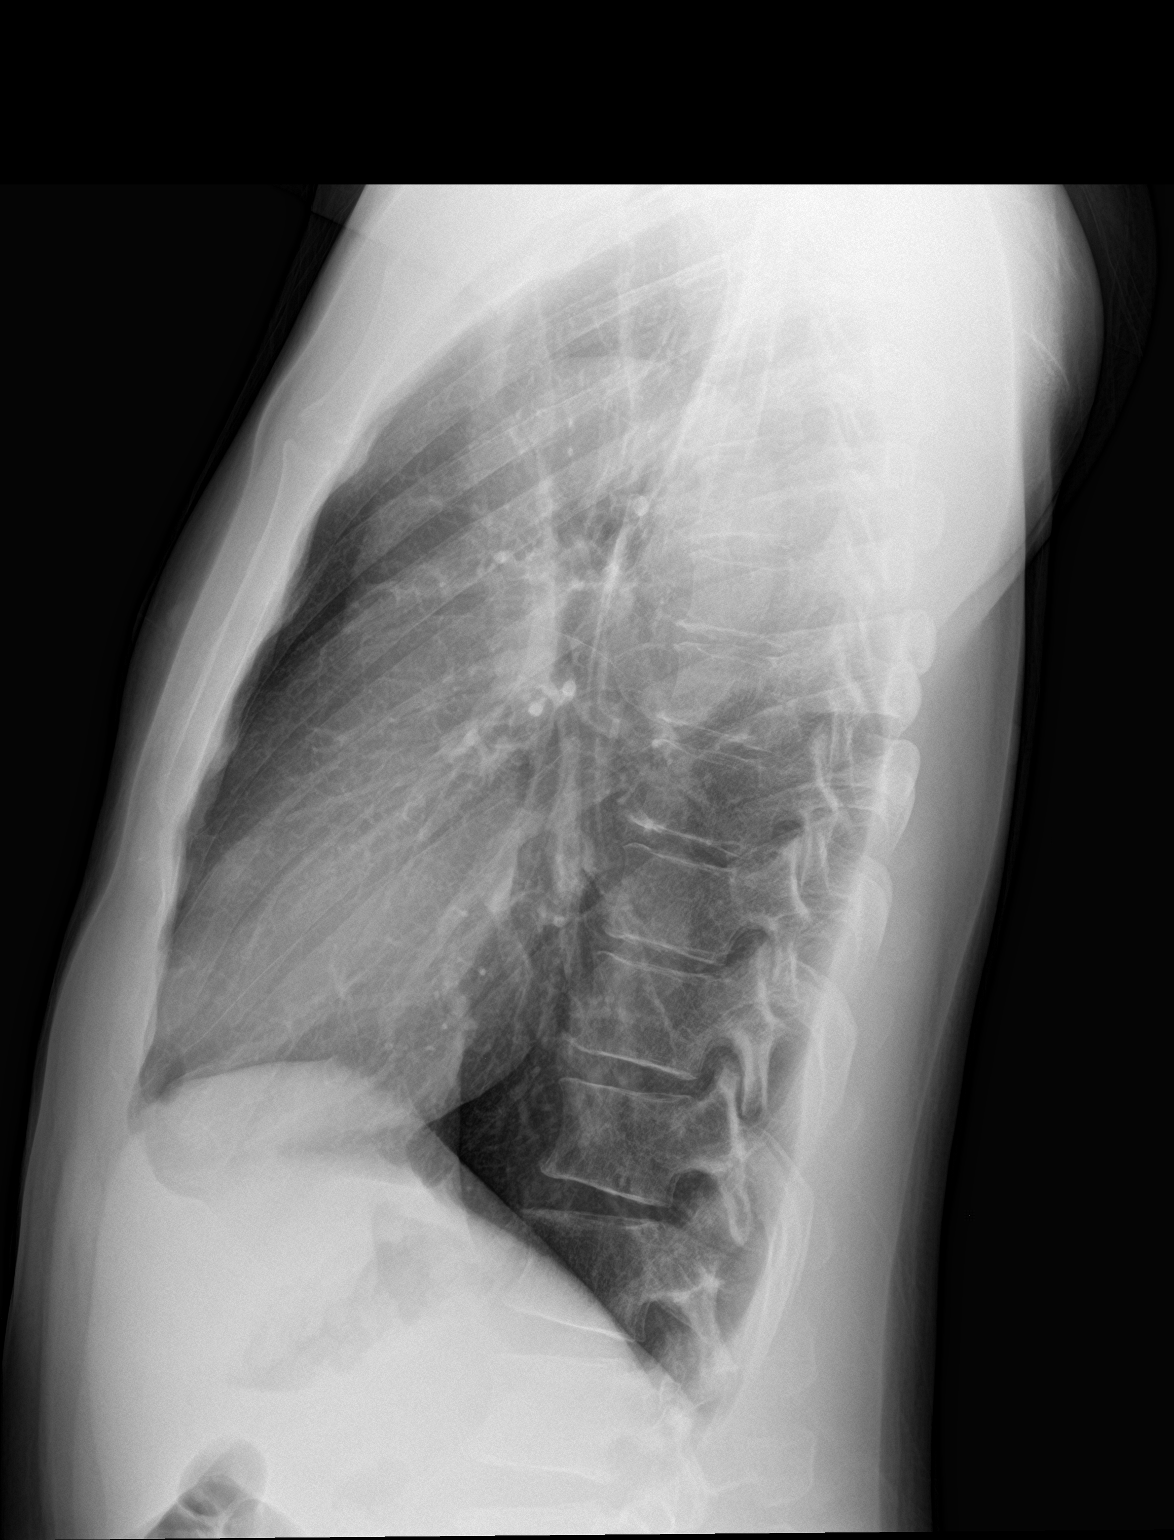

[2 of 2 positions shown; findings below may reference images not displayed]

FINDINGS: Heart size is normal. Some aortic tortuosity. Mediastinal shadows
otherwise normal. The lungs are clear. No central bronchial
thickening. No effusions. No bony abnormalities.
IMPRESSION: No active disease.  Somewhat tortuous aorta.

## 2017-07-09 ENCOUNTER — Emergency Department (HOSPITAL_COMMUNITY)
Admission: EM | Admit: 2017-07-09 | Discharge: 2017-07-10 | Disposition: A | Discharge status: Home / Self Care | Attending: Student | Admitting: Student

## 2017-07-09 ENCOUNTER — Encounter (HOSPITAL_COMMUNITY): Payer: Self-pay | Admitting: Emergency Medicine

## 2017-07-09 ENCOUNTER — Emergency Department (HOSPITAL_COMMUNITY)

## 2017-07-09 DIAGNOSIS — S0181XA Laceration without foreign body of other part of head, initial encounter: Secondary | ICD-10-CM | POA: Insufficient documentation

## 2017-07-09 DIAGNOSIS — S0990XA Unspecified injury of head, initial encounter: Secondary | ICD-10-CM | POA: Diagnosis present

## 2017-07-09 DIAGNOSIS — S0101XA Laceration without foreign body of scalp, initial encounter: Secondary | ICD-10-CM

## 2017-07-09 DIAGNOSIS — Y939 Activity, unspecified: Secondary | ICD-10-CM | POA: Insufficient documentation

## 2017-07-09 DIAGNOSIS — I10 Essential (primary) hypertension: Secondary | ICD-10-CM | POA: Diagnosis not present

## 2017-07-09 DIAGNOSIS — F17200 Nicotine dependence, unspecified, uncomplicated: Secondary | ICD-10-CM | POA: Diagnosis not present

## 2017-07-09 DIAGNOSIS — Y999 Unspecified external cause status: Secondary | ICD-10-CM | POA: Insufficient documentation

## 2017-07-09 DIAGNOSIS — M542 Cervicalgia: Secondary | ICD-10-CM | POA: Diagnosis not present

## 2017-07-09 DIAGNOSIS — Y92143 Cell of prison as the place of occurrence of the external cause: Secondary | ICD-10-CM | POA: Insufficient documentation

## 2017-07-09 DIAGNOSIS — Z79899 Other long term (current) drug therapy: Secondary | ICD-10-CM | POA: Diagnosis not present

## 2017-07-09 MED ORDER — LIDOCAINE-EPINEPHRINE-TETRACAINE (LET) SOLUTION
3.0000 mL | Freq: Once | NASAL | Status: AC
  Administered 2017-07-10: 3 mL via TOPICAL
  Filled 2017-07-09: qty 3

## 2017-07-09 MED ORDER — ACETAMINOPHEN 325 MG PO TABS
650.0000 mg | ORAL_TABLET | Freq: Once | ORAL | Status: AC
  Administered 2017-07-10: 650 mg via ORAL
  Filled 2017-07-09: qty 2

## 2017-07-09 MED ORDER — NAPROXEN 500 MG PO TABS
500.0000 mg | ORAL_TABLET | Freq: Once | ORAL | Status: AC
  Administered 2017-07-10: 500 mg via ORAL
  Filled 2017-07-09: qty 1
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
## 2017-07-10 MED ORDER — LIDOCAINE-EPINEPHRINE (PF) 2 %-1:200000 IJ SOLN
INTRAMUSCULAR | Status: AC
  Administered 2017-07-10: 20 mL
  Filled 2017-07-10: qty 20

## 2017-07-10 NOTE — Discharge Instructions (Signed)
WOUND CARE °Please have your stitches/staples removed in 3-5 days or sooner if you have concerns. You may do this at any available urgent care or at your primary care doctor's office. ° Keep area clean and dry for 24 hours. Do not remove °bandage, if applied. ° After 24 hours, remove bandage and wash wound °gently with mild soap and warm water. Reapply °a new bandage after cleaning wound, if directed. ° Continue daily cleansing with soap and water until °stitches/staples are removed. ° Do not apply any ointments or creams to the wound °while stitches/staples are in place, as this may cause °delayed healing. ° Seek medical careif you experience any of the following °signs of infection: Swelling, redness, pus drainage, °streaking, fever >101.0 F ° Seek care if you experience excessive bleeding °that does not stop after 15-20 minutes of constant, firm °pressure. ° ° ° ° °
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
## 2018-10-28 ENCOUNTER — Other Ambulatory Visit: Payer: Self-pay

## 2018-10-28 ENCOUNTER — Ambulatory Visit (HOSPITAL_COMMUNITY)
Admission: EM | Admit: 2018-10-28 | Discharge: 2018-10-28 | Disposition: A | Discharge status: Home / Self Care | Payer: Medicaid Other | Attending: Family Medicine | Admitting: Family Medicine

## 2018-10-28 ENCOUNTER — Encounter (HOSPITAL_COMMUNITY): Payer: Self-pay | Admitting: Emergency Medicine

## 2018-10-28 DIAGNOSIS — S0500XA Injury of conjunctiva and corneal abrasion without foreign body, unspecified eye, initial encounter: Secondary | ICD-10-CM | POA: Diagnosis not present

## 2018-10-28 MED ORDER — LUBRICANT EYE DROPS 0.4-0.3 % OP SOLN: 1.0000 [drp] | Freq: Three times a day (TID) | OPHTHALMIC | 0 refills | Status: DC | PRN

## 2018-10-28 MED ORDER — TETRACAINE HCL 0.5 % OP SOLN
OPHTHALMIC | Status: AC
  Filled 2018-10-28: qty 4

## 2018-10-28 MED ORDER — FLUORESCEIN SODIUM 1 MG OP STRP
ORAL_STRIP | OPHTHALMIC | Status: AC
  Filled 2018-10-28: qty 1

## 2018-10-28 MED ORDER — ERYTHROMYCIN 5 MG/GM OP OINT: TOPICAL_OINTMENT | OPHTHALMIC | 0 refills | Status: DC

## 2018-10-28 NOTE — Discharge Instructions (Addendum)
Keep ears clean and dry. Wash hands prior to using erythromycin ointment. Use lubricant eyedrops as needed for additional relief. Go to ophthalmologist if you have change in vision, worsening eye pain.

## 2018-10-28 NOTE — ED Triage Notes (Signed)
Pt sts bilateral eye pain with drainage x 1 week; pt is poor historian

## 2018-10-28 NOTE — ED Provider Notes (Addendum)
[place]    CSN: [id number] Arrival date & time: 10/28/18  1344     History   Chief Complaint Chief Complaint  Patient presents with  . Eye Pain    HPI Chad Salas is a 53 y.o. male presenting for by lateral eye irritation, pain, redness.  She states that this has been "coming and going for the last 2 weeks ".  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  Patient denies known foreign body exposure, though states "it could be possible ".  Patient states his eye pain hurts worse when it is close.  Patient endorses photophobia, though states this is intermittent as well.  Patient denies change in vision, blurry vision, headache, ear/nose/throat pain.  States he does get some watery discharge denies crusting over eyelids in the morning.  Patient has tried over-the-counter red eyedrops with minimal relief.    Past Medical History:  Diagnosis Date  . Hypertension     There are no active problems to display for this patient.   History reviewed. No pertinent surgical history.     Home Medications    Prior to Admission medications   Medication Sig Start Date End Date Taking? Authorizing Provider  cetirizine (ZYRTEC ALLERGY) 10 MG tablet Take 1 tablet (10 mg total) by mouth daily. Patient not taking: Reported on 07/09/2017 08/11/16   Lysbeth Penner, FNP  erythromycin ophthalmic ointment Place a 1/2 inch ribbon of ointment into the lower eyelid. 10/28/18   Hall-Potvin, [place], PA-C  Polyethyl Glycol-Propyl Glycol (LUBRICANT EYE DROPS) 0.4-0.3 % SOLN Apply 1 drop to eye 3 (three) times daily as needed. 10/28/18   Hall-Potvin, [place], PA-C    Family History History reviewed. No pertinent family history.  Social History Social History   Tobacco Use  . Smoking status: Current Every Day Smoker    Packs/day: 0.50  . Smokeless tobacco: Never Used  Substance Use Topics  . Alcohol use: Yes    Comment: 4-7 per week  . Drug use: No      Allergies   Lisinopril   Review of Systems As per HPI   Physical Exam Triage Vital Signs ED Triage Vitals [10/28/18 1431]  Enc Vitals Group     BP 115/90     Pulse Rate 97     Resp 18     Temp 98.2 F (36.8 C)     Temp Source Oral     SpO2 97 %     Weight      Height      Head Circumference      Peak Flow      Pain Score      Pain Loc      Pain Edu?      Excl. in [place]?    No data found.  Updated Vital Signs BP 115/90 (BP Location: Right Arm)   Pulse 97   Temp 98.2 F (36.8 C) (Oral)   Resp 18   SpO2 97%   Physical Exam Constitutional:      General: He is not in acute distress. HENT:     Head: Normocephalic and atraumatic.     Mouth/Throat:     Mouth: Mucous membranes are moist.     Pharynx: Oropharynx is clear.  Eyes:     General: No scleral icterus.    Extraocular Movements: Extraocular movements intact.     Pupils: Pupils are equal, round, and reactive to light.     Comments: Fluorescein  dye done in office: Increased uptake in the right orbit on superolateral aspect and left orbit on superolateral aspect.  Concerning for corneal abrasion.  No foreign bodies identified.  EOMs intact, nontender.  Patient denies pain when shining light in his eyes.  Cardiovascular:     Rate and Rhythm: Normal rate.  Pulmonary:     Effort: Pulmonary effort is normal.  Skin:    Coloration: Skin is not jaundiced or pale.  Neurological:     Mental Status: He is alert and oriented to person, place, and time.      UC Treatments / Results  Labs (all labs ordered are listed, but only abnormal results are displayed) Labs Reviewed - No data to display  EKG None  Radiology No results found.  Procedures Procedures (including critical care time)  Medications Ordered in UC Medications  tetracaine (PONTOCAINE) 0.5 % ophthalmic solution (has no administration in time range)  fluorescein 1 MG ophthalmic strip (has no administration in time range)    Initial  Impression / Assessment and Plan / UC Course  I have reviewed the triage vital signs and the nursing notes.  Pertinent labs & imaging results that were available during my care of the patient were reviewed by me and considered in my medical decision making (see chart for details).     53 year old male presenting for bilateral eye pain and irritation intermittently for the last 2 weeks.  Fluorescein dye done in office: Increased uptake in the right orbit on superolateral aspect and left orbit on superolateral aspect.  Concerning for corneal abrasion, will treat with erythromycin ointment and lubricant eyedrops.  Discussed return precautions, patient verbalized understanding. Final Clinical Impressions(s) / UC Diagnoses   Final diagnoses:  Corneal abrasion, unspecified laterality, initial encounter     Discharge Instructions     Keep ears clean and dry. Wash hands prior to using erythromycin ointment. Use lubricant eyedrops as needed for additional relief. Go to ophthalmologist if you have change in vision, worsening eye pain.    ED Prescriptions    Medication Sig Dispense Auth. Provider   erythromycin ophthalmic ointment Place a 1/2 inch ribbon of ointment into the lower eyelid. 3.5 g Hall-Potvin, [place], PA-C   Polyethyl Glycol-Propyl Glycol (LUBRICANT EYE DROPS) 0.4-0.3 % SOLN Apply 1 drop to eye 3 (three) times daily as needed. 10 mL Hall-Potvin, [place], PA-C     Controlled Substance Prescriptions [place] Controlled Substance Registry consulted? Not Applicable   Shea EvansHall-Potvin, , PA-C 10/28/18 [address]1832    Hall-Potvin, [place], [place] 10/28/18 [id number]

## 2019-10-30 DIAGNOSIS — Z419 Encounter for procedure for purposes other than remedying health state, unspecified: Secondary | ICD-10-CM | POA: Diagnosis not present

## 2019-11-30 DIAGNOSIS — Z419 Encounter for procedure for purposes other than remedying health state, unspecified: Secondary | ICD-10-CM | POA: Diagnosis not present

## 2019-12-31 DIAGNOSIS — Z419 Encounter for procedure for purposes other than remedying health state, unspecified: Secondary | ICD-10-CM | POA: Diagnosis not present

## 2020-01-30 DIAGNOSIS — Z419 Encounter for procedure for purposes other than remedying health state, unspecified: Secondary | ICD-10-CM | POA: Diagnosis not present

## 2020-03-01 DIAGNOSIS — Z419 Encounter for procedure for purposes other than remedying health state, unspecified: Secondary | ICD-10-CM | POA: Diagnosis not present

## 2020-03-29 ENCOUNTER — Inpatient Hospital Stay (HOSPITAL_COMMUNITY)
Admission: EM | Admit: 2020-03-29 | Discharge: 2020-04-01 | DRG: 177 | Disposition: A | Discharge status: Home / Self Care | Payer: Medicaid Other | Attending: Internal Medicine | Admitting: Internal Medicine

## 2020-03-29 ENCOUNTER — Emergency Department (HOSPITAL_COMMUNITY): Payer: Medicaid Other

## 2020-03-29 ENCOUNTER — Other Ambulatory Visit: Payer: Self-pay

## 2020-03-29 ENCOUNTER — Encounter (HOSPITAL_COMMUNITY): Payer: Self-pay

## 2020-03-29 DIAGNOSIS — F17200 Nicotine dependence, unspecified, uncomplicated: Secondary | ICD-10-CM | POA: Diagnosis present

## 2020-03-29 DIAGNOSIS — Z419 Encounter for procedure for purposes other than remedying health state, unspecified: Secondary | ICD-10-CM | POA: Diagnosis not present

## 2020-03-29 DIAGNOSIS — J9601 Acute respiratory failure with hypoxia: Secondary | ICD-10-CM

## 2020-03-29 DIAGNOSIS — R0789 Other chest pain: Secondary | ICD-10-CM | POA: Diagnosis not present

## 2020-03-29 DIAGNOSIS — R918 Other nonspecific abnormal finding of lung field: Secondary | ICD-10-CM | POA: Diagnosis not present

## 2020-03-29 DIAGNOSIS — I712 Thoracic aortic aneurysm, without rupture, unspecified: Secondary | ICD-10-CM | POA: Diagnosis present

## 2020-03-29 DIAGNOSIS — K7010 Alcoholic hepatitis without ascites: Secondary | ICD-10-CM | POA: Diagnosis present

## 2020-03-29 DIAGNOSIS — I700 Atherosclerosis of aorta: Secondary | ICD-10-CM | POA: Diagnosis not present

## 2020-03-29 DIAGNOSIS — J1282 Pneumonia due to coronavirus disease 2019: Principal | ICD-10-CM | POA: Diagnosis present

## 2020-03-29 DIAGNOSIS — F17210 Nicotine dependence, cigarettes, uncomplicated: Secondary | ICD-10-CM | POA: Diagnosis present

## 2020-03-29 DIAGNOSIS — U071 COVID-19: Principal | ICD-10-CM

## 2020-03-29 DIAGNOSIS — E860 Dehydration: Secondary | ICD-10-CM | POA: Diagnosis not present

## 2020-03-29 DIAGNOSIS — F1090 Alcohol use, unspecified, uncomplicated: Secondary | ICD-10-CM | POA: Diagnosis present

## 2020-03-29 DIAGNOSIS — I10 Essential (primary) hypertension: Secondary | ICD-10-CM | POA: Diagnosis present

## 2020-03-29 DIAGNOSIS — R911 Solitary pulmonary nodule: Secondary | ICD-10-CM | POA: Diagnosis present

## 2020-03-29 DIAGNOSIS — F10239 Alcohol dependence with withdrawal, unspecified: Secondary | ICD-10-CM | POA: Diagnosis not present

## 2020-03-29 DIAGNOSIS — K5730 Diverticulosis of large intestine without perforation or abscess without bleeding: Secondary | ICD-10-CM | POA: Diagnosis not present

## 2020-03-29 DIAGNOSIS — F1029 Alcohol dependence with unspecified alcohol-induced disorder: Secondary | ICD-10-CM | POA: Diagnosis not present

## 2020-03-29 DIAGNOSIS — D72818 Other decreased white blood cell count: Secondary | ICD-10-CM | POA: Diagnosis not present

## 2020-03-29 DIAGNOSIS — D6959 Other secondary thrombocytopenia: Secondary | ICD-10-CM | POA: Diagnosis not present

## 2020-03-29 DIAGNOSIS — F1020 Alcohol dependence, uncomplicated: Secondary | ICD-10-CM | POA: Diagnosis present

## 2020-03-29 DIAGNOSIS — K449 Diaphragmatic hernia without obstruction or gangrene: Secondary | ICD-10-CM | POA: Diagnosis not present

## 2020-03-29 DIAGNOSIS — M549 Dorsalgia, unspecified: Secondary | ICD-10-CM | POA: Diagnosis not present

## 2020-03-29 HISTORY — DX: Alcohol dependence, uncomplicated: F10.20

## 2020-03-29 HISTORY — DX: Thoracic aortic aneurysm, without rupture: I71.2

## 2020-03-29 HISTORY — DX: Thoracic aortic aneurysm, without rupture, unspecified: I71.20

## 2020-03-29 HISTORY — DX: Nicotine dependence, unspecified, uncomplicated: F17.200

## 2020-03-29 LAB — HEPATIC FUNCTION PANEL
ALT: 73 U/L — ABNORMAL HIGH (ref 0–44)
AST: 269 U/L — ABNORMAL HIGH (ref 15–41)
Albumin: 3.1 g/dL — ABNORMAL LOW (ref 3.5–5.0)
Alkaline Phosphatase: 213 U/L — ABNORMAL HIGH (ref 38–126)
Bilirubin, Direct: 0.5 mg/dL — ABNORMAL HIGH (ref 0.0–0.2)
Indirect Bilirubin: 0.7 mg/dL (ref 0.3–0.9)
Total Bilirubin: 1.2 mg/dL (ref 0.3–1.2)
Total Protein: 7.2 g/dL (ref 6.5–8.1)

## 2020-03-29 LAB — CBC
HCT: 41 % (ref 39.0–52.0)
Hemoglobin: 13.8 g/dL (ref 13.0–17.0)
MCH: 33.2 pg (ref 26.0–34.0)
MCHC: 33.7 g/dL (ref 30.0–36.0)
MCV: 98.6 fL (ref 80.0–100.0)
Platelets: 117 10*3/uL — ABNORMAL LOW (ref 150–400)
RBC: 4.16 MIL/uL — ABNORMAL LOW (ref 4.22–5.81)
RDW: 13.3 % (ref 11.5–15.5)
WBC: 2.7 10*3/uL — ABNORMAL LOW (ref 4.0–10.5)
nRBC: 0 % (ref 0.0–0.2)

## 2020-03-29 LAB — RESP PANEL BY RT-PCR (FLU A&B, COVID) ARPGX2
Influenza A by PCR: NEGATIVE
Influenza B by PCR: NEGATIVE
SARS Coronavirus 2 by RT PCR: POSITIVE — AB

## 2020-03-29 LAB — TROPONIN I (HIGH SENSITIVITY): Troponin I (High Sensitivity): 7 ng/L (ref <18)

## 2020-03-29 LAB — BASIC METABOLIC PANEL
Anion gap: 16 — ABNORMAL HIGH (ref 5–15)
BUN: <5 mg/dL — ABNORMAL LOW (ref 6–20)
CO2: 22 mmol/L (ref 22–32)
Calcium: 8.6 mg/dL — ABNORMAL LOW (ref 8.9–10.3)
Chloride: 98 mmol/L (ref 98–111)
Creatinine, Ser: 0.78 mg/dL (ref 0.61–1.24)
GFR, Estimated: >60 mL/min (ref >60)
Glucose, Bld: 81 mg/dL (ref 70–99)
Potassium: 3.6 mmol/L (ref 3.5–5.1)
Sodium: 136 mmol/L (ref 135–145)

## 2020-03-29 LAB — C-REACTIVE PROTEIN: CRP: 1.5 mg/dL — ABNORMAL HIGH (ref <1.0)

## 2020-03-29 LAB — TRIGLYCERIDES: Triglycerides: 90 mg/dL (ref <150)

## 2020-03-29 LAB — LACTIC ACID, PLASMA: Lactic Acid, Venous: 2.7 mmol/L (ref 0.5–1.9)

## 2020-03-29 LAB — FIBRINOGEN: Fibrinogen: 381 mg/dL (ref 210–475)

## 2020-03-29 LAB — D-DIMER, QUANTITATIVE: D-Dimer, Quant: 0.72 ug/mL-FEU — ABNORMAL HIGH (ref 0.00–0.50)

## 2020-03-29 LAB — LACTATE DEHYDROGENASE: LDH: 315 U/L — ABNORMAL HIGH (ref 98–192)

## 2020-03-29 LAB — FERRITIN: Ferritin: 497 ng/mL — ABNORMAL HIGH (ref 24–336)

## 2020-03-29 LAB — PROCALCITONIN: Procalcitonin: 0.29 ng/mL

## 2020-03-29 MED ORDER — GUAIFENESIN-DM 100-10 MG/5ML PO SYRP
10.0000 mL | ORAL_SOLUTION | ORAL | Status: DC | PRN
  Administered 2020-03-30: 10 mL via ORAL
  Filled 2020-03-29: qty 10

## 2020-03-29 MED ORDER — THIAMINE HCL 100 MG PO TABS
100.0000 mg | ORAL_TABLET | Freq: Every day | ORAL | Status: DC
  Administered 2020-03-29 – 2020-04-01 (×4): 100 mg via ORAL
  Filled 2020-03-29 (×4): qty 1

## 2020-03-29 MED ORDER — ASCORBIC ACID 500 MG PO TABS
500.0000 mg | ORAL_TABLET | Freq: Every day | ORAL | Status: DC
  Administered 2020-03-30 – 2020-04-01 (×3): 500 mg via ORAL
  Filled 2020-03-29 (×4): qty 1

## 2020-03-29 MED ORDER — SODIUM CHLORIDE 0.9% FLUSH: 3.0000 mL | INTRAVENOUS | Status: DC | PRN

## 2020-03-29 MED ORDER — LORAZEPAM 1 MG PO TABS
1.0000 mg | ORAL_TABLET | ORAL | Status: DC | PRN
  Administered 2020-03-30 (×2): 1 mg via ORAL
  Filled 2020-03-29 (×2): qty 1

## 2020-03-29 MED ORDER — ALBUTEROL SULFATE HFA 108 (90 BASE) MCG/ACT IN AERS
2.0000 | INHALATION_SPRAY | RESPIRATORY_TRACT | Status: DC | PRN
  Filled 2020-03-29: qty 6.7

## 2020-03-29 MED ORDER — SODIUM CHLORIDE 0.9 % IV BOLUS
1000.0000 mL | Freq: Once | INTRAVENOUS | Status: AC
  Administered 2020-03-29: 11:00:00 1000 mL via INTRAVENOUS

## 2020-03-29 MED ORDER — SODIUM CHLORIDE 0.9% FLUSH
3.0000 mL | Freq: Two times a day (BID) | INTRAVENOUS | Status: DC
  Administered 2020-03-29 – 2020-04-01 (×6): 3 mL via INTRAVENOUS

## 2020-03-29 MED ORDER — IOHEXOL 350 MG/ML SOLN
100.0000 mL | Freq: Once | INTRAVENOUS | Status: AC | PRN
  Administered 2020-03-29: 12:00:00 100 mL via INTRAVENOUS

## 2020-03-29 MED ORDER — ZINC SULFATE 220 (50 ZN) MG PO CAPS
220.0000 mg | ORAL_CAPSULE | Freq: Every day | ORAL | Status: DC
  Administered 2020-03-29 – 2020-04-01 (×4): 220 mg via ORAL
  Filled 2020-03-29 (×3): qty 1

## 2020-03-29 MED ORDER — FOLIC ACID 1 MG PO TABS
1.0000 mg | ORAL_TABLET | Freq: Every day | ORAL | Status: DC
  Administered 2020-03-29 – 2020-04-01 (×4): 1 mg via ORAL
  Filled 2020-03-29 (×4): qty 1

## 2020-03-29 MED ORDER — METHYLPREDNISOLONE SODIUM SUCC 40 MG IJ SOLR
0.5000 mg/kg | Freq: Two times a day (BID) | INTRAMUSCULAR | Status: DC
  Administered 2020-03-30: 38.4 mg via INTRAVENOUS
  Filled 2020-03-29: qty 1

## 2020-03-29 MED ORDER — PREDNISONE 5 MG PO TABS: 50.0000 mg | ORAL_TABLET | Freq: Every day | ORAL | Status: DC

## 2020-03-29 MED ORDER — THIAMINE HCL 100 MG/ML IJ SOLN: 100.0000 mg | Freq: Every day | INTRAMUSCULAR | Status: DC

## 2020-03-29 MED ORDER — SODIUM CHLORIDE 0.9 % IV SOLN
100.0000 mg | Freq: Every day | INTRAVENOUS | Status: DC
  Administered 2020-03-30 – 2020-04-01 (×3): 100 mg via INTRAVENOUS
  Filled 2020-03-29 (×4): qty 20

## 2020-03-29 MED ORDER — ENOXAPARIN SODIUM 40 MG/0.4ML ~~LOC~~ SOLN
40.0000 mg | SUBCUTANEOUS | Status: DC
  Administered 2020-03-29 – 2020-03-31 (×3): 40 mg via SUBCUTANEOUS
  Filled 2020-03-29 (×3): qty 0.4

## 2020-03-29 MED ORDER — ONDANSETRON HCL 4 MG/2ML IJ SOLN
4.0000 mg | Freq: Four times a day (QID) | INTRAMUSCULAR | Status: DC | PRN
  Administered 2020-03-29 – 2020-03-30 (×2): 4 mg via INTRAVENOUS
  Filled 2020-03-29 (×2): qty 2

## 2020-03-29 MED ORDER — POLYETHYLENE GLYCOL 3350 17 G PO PACK: 17.0000 g | PACK | Freq: Every day | ORAL | Status: DC | PRN

## 2020-03-29 MED ORDER — LACTATED RINGERS IV SOLN
INTRAVENOUS | Status: AC
  Administered 2020-03-29: 100 mL/h via INTRAVENOUS

## 2020-03-29 MED ORDER — HYDRALAZINE HCL 20 MG/ML IJ SOLN: 5.0000 mg | INTRAMUSCULAR | Status: DC | PRN

## 2020-03-29 MED ORDER — ONDANSETRON HCL 4 MG PO TABS: 4.0000 mg | ORAL_TABLET | Freq: Four times a day (QID) | ORAL | Status: DC | PRN

## 2020-03-29 MED ORDER — DEXAMETHASONE SODIUM PHOSPHATE 10 MG/ML IJ SOLN
8.0000 mg | Freq: Once | INTRAMUSCULAR | Status: AC
  Administered 2020-03-29: 8 mg via INTRAVENOUS
  Filled 2020-03-29: qty 1

## 2020-03-29 MED ORDER — SODIUM CHLORIDE 0.9 % IV SOLN
200.0000 mg | Freq: Once | INTRAVENOUS | Status: AC
  Administered 2020-03-29: 200 mg via INTRAVENOUS
  Filled 2020-03-29: qty 40

## 2020-03-29 MED ORDER — HYDROCOD POLST-CPM POLST ER 10-8 MG/5ML PO SUER
5.0000 mL | Freq: Two times a day (BID) | ORAL | Status: DC | PRN
  Administered 2020-03-31: 5 mL via ORAL
  Filled 2020-03-29: qty 5

## 2020-03-29 MED ORDER — DOCUSATE SODIUM 100 MG PO CAPS
100.0000 mg | ORAL_CAPSULE | Freq: Two times a day (BID) | ORAL | Status: DC
  Administered 2020-03-30: 100 mg via ORAL
  Filled 2020-03-29 (×3): qty 1

## 2020-03-29 MED ORDER — ACETAMINOPHEN 325 MG PO TABS
650.0000 mg | ORAL_TABLET | Freq: Four times a day (QID) | ORAL | Status: DC | PRN
  Administered 2020-03-29 – 2020-03-30 (×2): 650 mg via ORAL
  Filled 2020-03-29 (×2): qty 2

## 2020-03-29 MED ORDER — BISACODYL 5 MG PO TBEC: 5.0000 mg | DELAYED_RELEASE_TABLET | Freq: Every day | ORAL | Status: DC | PRN

## 2020-03-29 MED ORDER — FLEET ENEMA 7-19 GM/118ML RE ENEM: 1.0000 | ENEMA | Freq: Once | RECTAL | Status: DC | PRN

## 2020-03-29 MED ORDER — ADULT MULTIVITAMIN W/MINERALS CH
1.0000 | ORAL_TABLET | Freq: Every day | ORAL | Status: DC
  Administered 2020-03-29 – 2020-04-01 (×4): 1 via ORAL
  Filled 2020-03-29 (×4): qty 1

## 2020-03-29 MED ORDER — SODIUM CHLORIDE 0.9 % IV SOLN: 250.0000 mL | INTRAVENOUS | Status: DC | PRN

## 2020-03-29 MED ORDER — OXYCODONE HCL 5 MG PO TABS: 5.0000 mg | ORAL_TABLET | ORAL | Status: DC | PRN

## 2020-03-29 MED ORDER — LORAZEPAM 2 MG/ML IJ SOLN: 1.0000 mg | INTRAMUSCULAR | Status: DC | PRN

## 2020-03-29 NOTE — ED Notes (Addendum)
Ambulated pt in room. Pt reported weakness and fatigue. O2 went from 98% to 89% while ambulating. Pt's gait slow but steady. Pt's RR in 30's during ambulation and HR 120's

## 2020-03-29 NOTE — ED Notes (Signed)
Dinner Tray Ordered @ [phone].

## 2020-03-29 NOTE — H&P (Signed)
History and Physical    Chad Salas:[id number] DOB: 1965-11-01 DOA: 03/29/2020  PCP: Patient, No Pcp Per - has not seen a doctor in years Consultants:  None Patient coming from:  Home - lives alone; NOK: Aunt or girlfriend - declines having me call at this time   Chief Complaint: SOB, cough  HPI: Chad Salas is a 54 y.o. male with medical history significant of HTN presenting with SOB, cough.  He reports that he started with COVID symptoms about 3-4 days ago - fatigue, myalgias, fever, cough, SOB.  +anorexia, ?loss of taste/smell.  He feels very dehydrated.  He had Thanksgiving with his family despite symptoms.  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  He reports smoking 2ppd since age 33yo.  He also drinks heavily, about 12 beers per day; last drink was this AM.   ED Course:  L-sided CP, SOB, cough, unvaccinated.  Tortuous aorta with 4 cm thoracic aorta.  Also COVID +.  Persistent tachypnea, 88% with ambulation and HR 130s with RR 40s.   On 2L O2.  Review of Systems: As per HPI; otherwise review of systems reviewed and negative.   Ambulatory Status:  Ambulates without assistance  COVID Vaccine Status:  None  Past Medical History:  Diagnosis Date  . Alcohol dependence (HCC)   . Hypertension   . Thoracic aortic aneurysm (HCC)   . Tobacco dependence     History reviewed. No pertinent surgical history.  Social History   Socioeconomic History  . Marital status: Single    Spouse name: Not on file  . Number of children: Not on file  . Years of education: Not on file  . Highest education level: Not on file  Occupational History  . Not on file  Tobacco Use  . Smoking status: Current Every Day Smoker    Packs/day: 0.50  . Smokeless tobacco: Never Used  Substance and Sexual Activity  . Alcohol use: Yes    Comment: 4-7 per week  . Drug use: No  . Sexual activity: Not on file  Other Topics Concern  . Not on file  Social  History Narrative  . Not on file   Social Determinants of Health   Financial Resource Strain:   . Difficulty of Paying Living Expenses: Not on file  Food Insecurity:   . Worried About Programme researcher, broadcasting/film/video in the Last Year: Not on file  . Ran Out of Food in the Last Year: Not on file  Transportation Needs:   . Lack of Transportation (Medical): Not on file  . Lack of Transportation (Non-Medical): Not on file  Physical Activity:   . Days of Exercise per Week: Not on file  . Minutes of Exercise per Session: Not on file  Stress:   . Feeling of Stress : Not on file  Social Connections:   . Frequency of Communication with Friends and Family: Not on file  . Frequency of Social Gatherings with Friends and Family: Not on file  . Attends Religious Services: Not on file  . Active Member of Clubs or Organizations: Not on file  . Attends Banker Meetings: Not on file  . Marital Status: Not on file  Intimate Partner Violence:   . Fear of Current or Ex-Partner: Not on file  . Emotionally Abused: Not on file  . Physically Abused: Not on file  . Sexually Abused: Not on file    Allergies  Allergen  Reactions  . Lisinopril Anaphylaxis    No family history on file.  Prior to Admission medications   Medication Sig Start Date End Date Taking? Authorizing Provider  cetirizine (ZYRTEC ALLERGY) 10 MG tablet Take 1 tablet (10 mg total) by mouth daily. Patient not taking: Reported on 07/09/2017 08/11/16   Deatra Canter, FNP  erythromycin ophthalmic ointment Place a 1/2 inch ribbon of ointment into the lower eyelid. 10/28/18   Hall-Potvin, [place], PA-C  Polyethyl Glycol-Propyl Glycol (LUBRICANT EYE DROPS) 0.4-0.3 % SOLN Apply 1 drop to eye 3 (three) times daily as needed. 10/28/18   Hall-Potvin, [place], [place]    Physical Exam: Vitals:   03/29/20 1415 03/29/20 1515 03/29/20 1545 03/29/20 1645  BP: (!) 149/98 (!) 157/105 (!) 143/91 (!) 143/93  Pulse: 98 84 88 99  Resp: (!) 29  (!)  31 (!) 32  Temp:      TempSrc:      SpO2: 100% 96% 99% 97%  Weight:      Height:         . General:  Appears calm and comfortable and is NAD, on [place] O2 . Eyes:  PERRL, EOMI, normal lids, iris . ENT:  grossly normal hearing, lips & tongue, mmm . Neck:  no LAD, masses or thyromegaly . Cardiovascular:  RRR, no m/r/g. No LE edema.  Marland Kitchen Respiratory:   Scattered rhonchi.  Increased respiratory effort - moreso with any exertion . Abdomen:  soft, NT, ND, NABS . Skin:  no rash or induration seen on limited exam . Musculoskeletal:  grossly normal tone BUE/BLE, good ROM, no bony abnormality . Psychiatric:  blunted mood and affect, speech fluent and appropriate, AOx3 . Neurologic:  CN 2-12 grossly intact, moves all extremities in coordinated fashion    Radiological Exams on Admission: Independently reviewed - see discussion in A/P where applicable  DG Chest 2 View  Result Date: 03/29/2020 CLINICAL DATA:  Sharp left chest pain on and off for 2 days EXAM: CHEST - 2 VIEW COMPARISON:  04/04/2016 FINDINGS: Artifact from EKG pads. Equivocal indistinct opacity anterior to the left hilum. No edema, effusion, or pneumothorax. Normal heart size. Mild aortic tortuosity. IMPRESSION: Equivocal left perihilar infiltrate. Followup PA and lateral chest X-ray is recommended in 3-4 weeks to ensure resolution. Electronically Signed   By: Marnee Spring M.D.   On: 03/29/2020 07:49   CT Angio Chest/Abd/Pel for Dissection W and/or W/WO  Result Date: 03/29/2020 CLINICAL DATA:  Chest pain, back pain in a 54 year old male EXAM: CT ANGIOGRAPHY CHEST, ABDOMEN AND PELVIS TECHNIQUE: Non-contrast CT of the chest was initially obtained. Multidetector CT imaging through the chest, abdomen and pelvis was performed using the standard protocol during bolus administration of intravenous contrast. Multiplanar reconstructed images and MIPs were obtained and reviewed to evaluate the vascular anatomy. CONTRAST:  OMNIPAQUE IOHEXOL  350 MG/ML SOLN COMPARISON:  Prior chest x-rays, no previous cross-sectional imaging of the chest. FINDINGS: CTA CHEST FINDINGS Cardiovascular: 4 cm ascending thoracic aorta. Noncontrast images without signs of periaortic stranding or intramural hematoma. Three-vessel branching pattern in the chest with patent great vessels. Sinuses of Valsalva at 4.3 cm. Aortic root at 4.0 cm. Descending thoracic aorta is nonaneurysmal. Central pulmonary vasculature unremarkable with limited assessment. Heart size mildly enlarged without pericardial effusion. Mediastinum/Nodes: Thoracic inlet structures are normal. No axillary lymphadenopathy. No mediastinal or hilar lymphadenopathy. Esophagus grossly normal. Lungs/Pleura: Patchy areas of ground-glass opacity at the periphery of the RIGHT and LEFT chest. Discrete RIGHT lower lobe pulmonary nodule (image  140, series 7) 8 x 6 mm. Ground-glass nodule in the RIGHT lower lobe at the periphery (image 109, series 7) 7 x 7 mm. Other areas with more geographic ground-glass characteristics, for instance in the RIGHT upper lobe on image 67 of series 7 and in the lingula and LEFT upper lobe on images 81-102 of series 7. Patchy ground-glass scattered throughout the LEFT lower lobe. No dense consolidation or evidence of pleural effusion. Musculoskeletal: See below for full musculoskeletal detail. Review of the MIP images confirms the above findings. CTA ABDOMEN AND PELVIS FINDINGS VASCULAR Aorta: Normal caliber abdominal aorta without aneurysmal dilation in the abdomen. No periaortic stranding. No wall thickening. Celiac: Patent celiac axis. Hepatic arterial supply derive both from the celiac and SMA with accessory hepatic artery arising from the SMA. SMA: Patent SMA. Accessory RIGHT hepatic artery is derive from the SMA as described above. Renals: Accessory renal arteries bilaterally to the lower pole of the RIGHT and LEFT kidney. Second accessory artery on the LEFT also supplies the lower pole  symmetric renal enhancement. IMA: Patent without evidence of aneurysm, dissection, vasculitis or significant stenosis. Inflow: Patent without evidence of aneurysm, dissection, vasculitis or significant stenosis. Veins: Not well evaluated but normal smooth contour of the IVC. Venous phase not obtained. Outflow: Patent into the upper thigh without aneurysmal caliber of the iliac vasculature. Review of the MIP images confirms the above findings. NON-VASCULAR Hepatobiliary: Probable hepatic steatosis. No gross lesion on arterial phase. No pericholecystic stranding. Pancreas: Normal Spleen: Normal Adrenals/Urinary Tract: Normal adrenal glands. Symmetric renal enhancement. No hydronephrosis. Possible RIGHT intrarenal calculus in the lower pole. Areas of excreted contrast however in the collecting systems Stomach/Bowel: Small hiatal hernia. Colonic diverticulosis. No acute gastrointestinal process. Lymphatic: There is no gastrohepatic or hepatoduodenal ligament lymphadenopathy. No retroperitoneal or mesenteric lymphadenopathy. No pelvic sidewall lymphadenopathy. Reproductive: Unremarkable CT appearance of the prostate gland. Other: No ascites. Musculoskeletal: No acute bone finding. No destructive bone process. Spinal degenerative changes. Review of the MIP images confirms the above findings. IMPRESSION: 1. Patchy areas of ground-glass opacity at the periphery of the RIGHT and LEFT chest. Other areas with more geographic ground-glass characteristics. Findings are nonspecific and could be seen with multifocal pneumonia, perhaps atypical or viral. COVID-19 is considered. 2. Pulmonary nodules in the RIGHT lower lobe measuring up to 8 mm. This 8 mm nodule is solid and without signs of calcification. Could also be related to infection or inflammation though would suggest 3 month follow-up to ensure resolution given that it displays solid, noncalcified appearance and is different from other areas of opacity in the chest. 3.  Ascending thoracic aortic aneurysm at 4 cm without signs of dissection. Recommend annual imaging followup by CTA or MRA. This recommendation follows 2010 ACCF/AHA/AATS/ACR/ASA/SCA/SCAI/SIR/STS/SVM Guidelines for the Diagnosis and Management of Patients with Thoracic Aortic Disease. Circulation. 2010; 121: X528-U132. Aortic aneurysm NOS (ICD10-I71.9) 4. No evidence of abdominal aortic aneurysm or dissection. 5. Hepatic arterial supply derived both from the celiac and SMA with accessory hepatic artery arising from the SMA. 6. Possible RIGHT intrarenal calculus in the lower pole. 7. Small hiatal hernia. 8. Colonic diverticulosis without evidence of acute gastrointestinal process. 9. Aortic atherosclerosis. These results were called by telephone at the time of interpretation on 03/29/2020 at 12:09 pm to provider Dr. Dalene Seltzer, Who verbally acknowledged these results. Aortic Atherosclerosis (ICD10-I70.0). Electronically Signed   By: Donzetta Kohut M.D.   On: 03/29/2020 12:03    EKG: Independently reviewed.  NSR with rate 100; nonspecific ST changes with  no evidence of acute ischemia   Labs on Admission: I have personally reviewed the available labs and imaging studies at the time of the admission.  Pertinent labs:   Anion gap 16 AP 213 Albumin 3.1 AST 269//ALT 73 HS troponin 7 WBC 2.7; usually normal Platelets 117 COVID POSITIVE LDH 315 Ferritin 497 CRP 1.5 Procalcitonin 0.29 D-dimer 0.72 Fibrinogen 381 Lactate 2.7   Assessment/Plan Principal Problem:   Acute hypoxemic respiratory failure due to COVID-19 [facility]) Active Problems:   Alcohol dependence (HCC)   Hypertension   Tobacco dependence   Thoracic aortic aneurysm (HCC)   Acute respiratory failure with hypoxia due to COVID-19 PNA -Patient with presenting with SOB and reported fever at home; also with cough  -Anorexia noted without the presence of other GI symptoms -He does not have a usual home O2 requirement and is currently  requiring 2L [place] O2 -COVID POSITIVE -The patient has no known comorbidities which may increase the risk for ARDS/MODS but also does not see physicians -Pertinent labs concerning for COVID include leukopenia; increased LFTs; increased LDH; elevated D-dimer (but not >1); increased ferritin; low procalcitonin -CT chest showed ground glass opacities which may be c/w COVID vs. Multifocal PN   -Will not treat with broad-spectrum antibiotics given procalcitonin <0.5 -Will admit for further evaluation, close monitoring, and treatment -Monitor on telemetry x at least 24 hours -At this time, will attempt to avoid use of aerosolized medications and use HFAs instead -Will check daily labs including BMP with Mag, Phos; LFTs; CBC with differential; CRP; ferritin; fibrinogen; D-dimer -Will order steroids and Remdesivir (pharmacy consult) given +COVID test, +CXR, and hypoxia <94% on room air -If the patient shows clinical deterioration, consider transfer to ICU with PCCM consultation -Consider IL-6 agonist (Actemra) and/or JAK inhibitor (baricitinib) if the patient does not stabilize on current treatment or if the patient has marked clinical decompensation; the patient does not appear to require this treatment at this time but has been consented and agrees to receive treatment if there is evidence of clinical worsening despite current treatments. -Will attempt to maintain euvolemia to a net negative fluid status; he has a + anion gap and so will give 1L total IVF -Will ask the patient to maintain an awake prone position for 16+ hours a day, if possible, with a minimum of 2-3 hours at a time -Patient was seen wearing full PPE including: gown, gloves, head cover, N95, and face shield; donning and doffing was in compliance with current standards.  HTN -He does not appear to be taking medications for this issue at this time -He was admitted in 2009 for angioedema from Lisinopril -Suboptimal control in the ER may be  associated with uncontrolled HTN or may be related to acute illness -Will cover with IV prn hydralazine for now and consider long-term treatment once COVID is under better control  ETOH dependence -Patient with chronic ETOH dependence -Number of drinks per day: 12 -Number of admissions for management of alcohol withdrawal syndrome (detox): 0 -History of withdrawal seizures, ICU admissions, DTs: 0 -He is at risk for complications of withdrawal including seizures, DTs -CIWA protocol -Folate, thiamine, and MVI ordered -Will provide symptom-triggered BZD (ativan per CIWA protocol) only since the patient is able to communicate; is not showing current signs of delirium; and has no history of severe withdrawal. -TOC team consult for substance abuse education -Will also check UDS. -Elevated LFTs are likely related to alcoholism -Consider offering a medication for Alcohol Use Disorder at the time of  d/c, to include Disulfuram; Naltrexone; or Acamprosate.  Tobacco dependence -Encourage cessation.   -This was discussed with the patient and should be reviewed on an ongoing basis.   -Patch declined by patient despite h/o heavy use (2 ppd)  Thoracic aortic aneurysm -Incidentally found on CT -Needs annual screening -Unlikely to be related to current presenting complaint      DVT prophylaxis:  Lovenox  Code Status:  Full - confirmed with patient Family Communication: None present; he declined to have me contact family at the time of admission Disposition Plan:  The patient is from: home  [facility]ticipated [facility] once [facility] issues have been resolved.  He may require home O2 at the time of discharge.  Anticipated d/c date will depend on clinical response to treatment, likely between 3 days (with completion of outpatient Remdesivir treatment) and 5 days  Patient is currently: acutely ill Consults called: None  Admission status: Admit - It is my clinical opinion  that admission to INPATIENT is reasonable and necessary because of the expectation that this patient will require hospital care that crosses at least 2 midnights to treat this condition based on the medical complexity of the problems presented.  Given the aforementioned information, the predictability of an adverse outcome is felt to be significant.    Jonah BlueJennifer Ezekiah Massie MD [facility]   How to contact the [facility] Attending or Consulting provider 7A - 7P or covering provider during after hours 7P -7A, for this patient?  1. Check the care team in [facility] and look for a) attending/consulting [facility] provider listed and b) the [facility] team listed 2. Log into [email or web address] and use [facility] universal password to access. If you do not have the password, please contact the hospital operator. 3. Locate the [facility] provider you are looking for under [facility] and page to a number that you can be directly reached. 4. If you still have difficulty reaching the provider, please page the [facility] (Director on Call) for the Hospitalists listed on amion for assistance.   03/29/2020, 5:21 PM

## 2020-03-29 NOTE — ED Triage Notes (Signed)
Pt reports left sided chest pain for the past 2 days with coughing and sob. Hx of HTN but is not taking any meds. Pt a.o, nad noted

## 2020-03-29 NOTE — ED Provider Notes (Signed)
[facility] EMERGENCY DEPARTMENT Provider Note   CSN: [id number] Arrival date & time: 03/29/20  4010    History Chief Complaint  Patient presents with  . Chest Pain    Chad Salas is a 54 y.o. male with past medical history significant for hypertension, no longer medication who presents for evaluation of chest pain over the past 2 days.  Patient states he has intermittent chest pain over the last 3 days.  Nonexertional occasionally pleuritic in nature.  He has had cough productive of clear sputum.  States he occasionally gets short of breath when he coughs however no shortness of breath with exertion.  No associated diaphoresis, nausea or vomiting.  He is not vaccinated against Covid.  He denies any known Covid exposures.  He denies fever, chills, nausea, vomiting, hemoptysis, abdominal pain, diarrhea, dysuria, paresthesias, redness, swelling, warmth.  Patient states he does previously have a known issue with his aorta however he has not followed up with this.  He does not follow with a PCP.  He is not sure exactly issues he has had previously with his aorta.  He has no current pain.  Denies additional aggravating or alleviating factors.  Does occasionally have lower back pain however none currently.  Rates his current pain a 0/10.    History obtained from patient and past medical records.  No interpreter used.   HPI     Past Medical History:  Diagnosis Date  . Alcohol dependence (HCC)   . Hypertension   . Thoracic aortic aneurysm (HCC)   . Tobacco dependence     Patient Active Problem List   Diagnosis Date Noted  . Acute hypoxemic respiratory failure due to COVID-19 (HCC) 03/29/2020  . Alcohol dependence (HCC)   . Hypertension   . Tobacco dependence   . Thoracic aortic aneurysm [facility])     History reviewed. No pertinent surgical history.     No family history on file.  Social History   Tobacco Use  . Smoking status: Current Every Day Smoker     Packs/day: 0.50  . Smokeless tobacco: Never Used  Substance Use Topics  . Alcohol use: Yes    Comment: 4-7 per week  . Drug use: No    Home Medications Prior to Admission medications   Medication Sig Start Date End Date Taking? Authorizing Provider  Aspirin-Acetaminophen-Caffeine (GOODY HEADACHE PO) Take 1 packet by mouth daily as needed (headache).   Yes [provider]   Allergies    Lisinopril  Review of Systems   Review of Systems  Constitutional: Negative.   HENT: Negative.   Respiratory: Positive for cough and shortness of breath. Negative for apnea, choking, chest tightness, wheezing and stridor.   Cardiovascular: Positive for chest pain. Negative for palpitations and leg swelling.  Gastrointestinal: Negative.   Genitourinary: Negative.   Musculoskeletal: Negative.   Skin: Negative.   Neurological: Negative.   All other systems reviewed and are negative.   Physical Exam Updated Vital Signs BP (!) 157/111   Pulse 98   Temp 100.1 F (37.8 C) (Oral)   Resp (!) 35   Ht  (1.803 m)   Wt 77.1 kg   SpO2 92%   BMI 23.71 kg/m   Physical Exam Vitals and nursing note reviewed.  Constitutional:      General: He is not in acute distress.    Appearance: He is well-developed. He is not ill-appearing, toxic-appearing or diaphoretic.  HENT:     Head: Normocephalic and  atraumatic.     Jaw: There is normal jaw occlusion.     Right Ear: Tympanic membrane, ear canal and external ear normal. There is no impacted cerumen. No hemotympanum. Tympanic membrane is not injected, scarred, perforated, erythematous, retracted or bulging.     Left Ear: Tympanic membrane, ear canal and external ear normal. There is no impacted cerumen. No hemotympanum. Tympanic membrane is not injected, scarred, perforated, erythematous, retracted or bulging.     Ears:     Comments: No Mastoid tenderness.    Nose:     Comments: Clear rhinorrhea and congestion to bilateral nares.  No sinus  tenderness.    Mouth/Throat:     Lips: Pink.     Mouth: Mucous membranes are moist.     Pharynx: Uvula midline.     Comments: Mucous membranes moist, Tongue midline Eyes:     Extraocular Movements: Extraocular movements intact.     Conjunctiva/sclera: Conjunctivae normal.     Pupils: Pupils are equal, round, and reactive to light.  Neck:     Trachea: Trachea and phonation normal.     Meningeal: Brudzinski's sign and Kernig's sign absent.     Comments: Full ROM without difficulty Cardiovascular:     Rate and Rhythm: Normal rate and regular rhythm.     Pulses: Normal pulses.          Radial pulses are 2+ on the right side and 2+ on the left side.       Dorsalis pedis pulses are 2+ on the right side and 2+ on the left side.       Posterior tibial pulses are 2+ on the right side and 2+ on the left side.     Heart sounds: Normal heart sounds.     Comments: No murmurs rubs or gallops. Pulmonary:     Effort: Pulmonary effort is normal. No respiratory distress.     Breath sounds: Normal breath sounds and air entry.     Comments: Clear to auscultation bilaterally without wheeze, rhonchi or rales.  No accessory muscle usage.  Able speak in full sentences. Chest:     Chest wall: Tenderness present. No mass, deformity, swelling, crepitus or edema.    Abdominal:     General: Bowel sounds are normal. There is no distension.     Palpations: Abdomen is soft. There is no mass or pulsatile mass.     Tenderness: There is no abdominal tenderness. There is no right CVA tenderness, left CVA tenderness, guarding or rebound. Negative signs include Murphy's sign and McBurney's sign.     Hernia: No hernia is present.     Comments: Soft, nontender without rebound or guarding.  No CVA tenderness. No midline pulsatile abd mass.  Musculoskeletal:        General: Normal range of motion.     Cervical back: Full passive range of motion without pain, normal range of motion and neck supple.     Right lower leg:  No tenderness. No edema.     Left lower leg: No tenderness. No edema.     Comments: Moves all 4 extremities without difficulty.  Lower extremities without edema, erythema or warmth. Homans sign negative  Lymphadenopathy:     Cervical: No cervical adenopathy.  Skin:    General: Skin is warm and dry.     Capillary Refill: Capillary refill takes less than 2 seconds.     Comments: Brisk capillary refill.  No rashes or lesions. No edema, erythema, warmth  Neurological:  General: No focal deficit present.     Mental Status: He is alert and oriented to person, place, and time.     Cranial Nerves: Cranial nerves are intact.     Sensory: Sensation is intact.     Motor: Motor function is intact.     Coordination: Coordination is intact.     Gait: Gait is intact.     Comments: Ambulatory in department without difficulty.  Cranial nerves II through XII grossly intact.  No facial droop.  No aphasia.     ED Results / Procedures / Treatments   Labs (all labs ordered are listed, but only abnormal results are displayed) Labs Reviewed  RESP PANEL BY RT-PCR (FLU A&B, COVID) ARPGX2 - Abnormal; Notable for the following components:      Result Value   SARS Coronavirus 2 by RT PCR POSITIVE (*)    All other components within normal limits  BASIC METABOLIC PANEL - Abnormal; Notable for the following components:   BUN <5 (*)    Calcium 8.6 (*)    Anion gap 16 (*)    All other components within normal limits  CBC - Abnormal; Notable for the following components:   WBC 2.7 (*)    RBC 4.16 (*)    Platelets 117 (*)    All other components within normal limits  LACTIC ACID, PLASMA - Abnormal; Notable for the following components:   Lactic Acid, Venous 2.7 (*)    All other components within normal limits  D-DIMER, QUANTITATIVE (NOT AT [facility]) - Abnormal; Notable for the following components:   D-Dimer, Quant 0.72 (*)    All other components within normal limits  LACTATE DEHYDROGENASE - Abnormal;  Notable for the following components:   LDH 315 (*)    All other components within normal limits  FERRITIN - Abnormal; Notable for the following components:   Ferritin 497 (*)    All other components within normal limits  C-REACTIVE PROTEIN - Abnormal; Notable for the following components:   CRP 1.5 (*)    All other components within normal limits  HEPATIC FUNCTION PANEL - Abnormal; Notable for the following components:   Albumin 3.1 (*)    AST 269 (*)    ALT 73 (*)    Alkaline Phosphatase 213 (*)    Bilirubin, Direct 0.5 (*)    All other components within normal limits  CULTURE, BLOOD (ROUTINE X 2)  CULTURE, BLOOD (ROUTINE X 2)  PROCALCITONIN  FIBRINOGEN  TRIGLYCERIDES  HIV ANTIBODY (ROUTINE TESTING W REFLEX)  CBC WITH DIFFERENTIAL/PLATELET  COMPREHENSIVE METABOLIC PANEL  C-REACTIVE PROTEIN  D-DIMER, QUANTITATIVE (NOT AT [facility])  FERRITIN  MAGNESIUM  PHOSPHORUS  RAPID URINE DRUG SCREEN, HOSP PERFORMED  TROPONIN I (HIGH SENSITIVITY)    EKG EKG Interpretation  Date/Time:  Monday March 29 2020 07:14:00 EST Ventricular Rate:  100 PR Interval:  184 QRS Duration: 80 QT Interval:  390 QTC Calculation: 503 R Axis:     Text Interpretation: Normal sinus rhythm Minimal voltage criteria for LVH, may be normal variant ( Sokolow-Lyon ) Anterior infarct , age undetermined Prolonged QT Abnormal ECG Appears to have new ST abnormality aVL, V5, wandering baseline. Confirmed by Alvira MondaySchlossman, Erin ([id number]) on 03/29/2020 9:20:27 AM   Radiology DG Chest 2 View  Result Date: 03/29/2020 CLINICAL DATA:  Lambert ModySharp left chest pain on and off for 2 days EXAM: CHEST - 2 VIEW COMPARISON:  04/04/2016 FINDINGS: Artifact from EKG pads. Equivocal indistinct opacity anterior to the left hilum. No edema, effusion,  or pneumothorax. Normal heart size. Mild aortic tortuosity. IMPRESSION: Equivocal left perihilar infiltrate. Followup PA and lateral chest X-ray is recommended in 3-4 weeks to ensure resolution.  Electronically Signed   By: Marnee Spring M.D.   On: 03/29/2020 07:49   CT Angio Chest/Abd/Pel for Dissection W and/or W/WO  Result Date: 03/29/2020 CLINICAL DATA:  Chest pain, back pain in a 54 year old male EXAM: CT ANGIOGRAPHY CHEST, ABDOMEN AND PELVIS TECHNIQUE: Non-contrast CT of the chest was initially obtained. Multidetector CT imaging through the chest, abdomen and pelvis was performed using the standard protocol during bolus administration of intravenous contrast. Multiplanar reconstructed images and MIPs were obtained and reviewed to evaluate the vascular anatomy. CONTRAST:  OMNIPAQUE IOHEXOL 350 MG/ML SOLN COMPARISON:  Prior chest x-rays, no previous cross-sectional imaging of the chest. FINDINGS: CTA CHEST FINDINGS Cardiovascular: 4 cm ascending thoracic aorta. Noncontrast images without signs of periaortic stranding or intramural hematoma. Three-vessel branching pattern in the chest with patent great vessels. Sinuses of Valsalva at 4.3 cm. Aortic root at 4.0 cm. Descending thoracic aorta is nonaneurysmal. Central pulmonary vasculature unremarkable with limited assessment. Heart size mildly enlarged without pericardial effusion. Mediastinum/Nodes: Thoracic inlet structures are normal. No axillary lymphadenopathy. No mediastinal or hilar lymphadenopathy. Esophagus grossly normal. Lungs/Pleura: Patchy areas of ground-glass opacity at the periphery of the RIGHT and LEFT chest. Discrete RIGHT lower lobe pulmonary nodule (image 140, series 7) 8 x 6 mm. Ground-glass nodule in the RIGHT lower lobe at the periphery (image 109, series 7) 7 x 7 mm. Other areas with more geographic ground-glass characteristics, for instance in the RIGHT upper lobe on image 67 of series 7 and in the lingula and LEFT upper lobe on images 81-102 of series 7. Patchy ground-glass scattered throughout the LEFT lower lobe. No dense consolidation or evidence of pleural effusion. Musculoskeletal: See below for full  musculoskeletal detail. Review of the MIP images confirms the above findings. CTA ABDOMEN AND PELVIS FINDINGS VASCULAR Aorta: Normal caliber abdominal aorta without aneurysmal dilation in the abdomen. No periaortic stranding. No wall thickening. Celiac: Patent celiac axis. Hepatic arterial supply derive both from the celiac and SMA with accessory hepatic artery arising from the SMA. SMA: Patent SMA. Accessory RIGHT hepatic artery is derive from the SMA as described above. Renals: Accessory renal arteries bilaterally to the lower pole of the RIGHT and LEFT kidney. Second accessory artery on the LEFT also supplies the lower pole symmetric renal enhancement. IMA: Patent without evidence of aneurysm, dissection, vasculitis or significant stenosis. Inflow: Patent without evidence of aneurysm, dissection, vasculitis or significant stenosis. Veins: Not well evaluated but normal smooth contour of the IVC. Venous phase not obtained. Outflow: Patent into the upper thigh without aneurysmal caliber of the iliac vasculature. Review of the MIP images confirms the above findings. NON-VASCULAR Hepatobiliary: Probable hepatic steatosis. No gross lesion on arterial phase. No pericholecystic stranding. Pancreas: Normal Spleen: Normal Adrenals/Urinary Tract: Normal adrenal glands. Symmetric renal enhancement. No hydronephrosis. Possible RIGHT intrarenal calculus in the lower pole. Areas of excreted contrast however in the collecting systems Stomach/Bowel: Small hiatal hernia. Colonic diverticulosis. No acute gastrointestinal process. Lymphatic: There is no gastrohepatic or hepatoduodenal ligament lymphadenopathy. No retroperitoneal or mesenteric lymphadenopathy. No pelvic sidewall lymphadenopathy. Reproductive: Unremarkable CT appearance of the prostate gland. Other: No ascites. Musculoskeletal: No acute bone finding. No destructive bone process. Spinal degenerative changes. Review of the MIP images confirms the above findings.  IMPRESSION: 1. Patchy areas of ground-glass opacity at the periphery of the RIGHT and LEFT chest. Other  areas with more geographic ground-glass characteristics. Findings are nonspecific and could be seen with multifocal pneumonia, perhaps atypical or viral. COVID-19 is considered. 2. Pulmonary nodules in the RIGHT lower lobe measuring up to 8 mm. This 8 mm nodule is solid and without signs of calcification. Could also be related to infection or inflammation though would suggest 3 month follow-up to ensure resolution given that it displays solid, noncalcified appearance and is different from other areas of opacity in the chest. 3. Ascending thoracic aortic aneurysm at 4 cm without signs of dissection. Recommend annual imaging followup by CTA or MRA. This recommendation follows 2010 ACCF/AHA/AATS/ACR/ASA/SCA/SCAI/SIR/STS/SVM Guidelines for the Diagnosis and Management of Patients with Thoracic Aortic Disease. Circulation. 2010; 121: N829-F621. Aortic aneurysm NOS (ICD10-I71.9) 4. No evidence of abdominal aortic aneurysm or dissection. 5. Hepatic arterial supply derived both from the celiac and SMA with accessory hepatic artery arising from the SMA. 6. Possible RIGHT intrarenal calculus in the lower pole. 7. Small hiatal hernia. 8. Colonic diverticulosis without evidence of acute gastrointestinal process. 9. Aortic atherosclerosis. These results were called by telephone at the time of interpretation on 03/29/2020 at 12:09 pm to provider Dr. Dalene Seltzer, Who verbally acknowledged these results. Aortic Atherosclerosis (ICD10-I70.0). Electronically Signed   By: Donzetta Kohut M.D.   On: 03/29/2020 12:03   Procedures .Critical Care Performed by: Linwood Dibbles, PA-C Authorized by: Linwood Dibbles, PA-C   Critical care provider statement:    Critical care time (minutes):  35   Critical care was necessary to treat or prevent imminent or life-threatening deterioration of the following conditions:  Respiratory  failure   Critical care was time spent personally by me on the following activities:  Discussions with consultants, evaluation of patient's response to treatment, examination of patient, ordering and performing treatments and interventions, ordering and review of laboratory studies, ordering and review of radiographic studies, pulse oximetry, re-evaluation of patient's condition, obtaining history from patient or surrogate and review of old charts   (including critical care time)  Medications Ordered in ED Medications  enoxaparin (LOVENOX) injection 40 mg (40 mg Subcutaneous Given 03/29/20 1810)  sodium chloride flush (NS) 0.9 % injection 3 mL (has no administration in time range)  sodium chloride flush (NS) 0.9 % injection 3 mL (has no administration in time range)  sodium chloride flush (NS) 0.9 % injection 3 mL (has no administration in time range)  0.9 %  sodium chloride infusion (has no administration in time range)  remdesivir 200 mg in sodium chloride 0.9% 250 mL IVPB (200 mg Intravenous New Bag/Given 03/29/20 1833)    Followed by  remdesivir 100 mg in sodium chloride 0.9 % 100 mL IVPB (has no administration in time range)  albuterol (VENTOLIN HFA) 108 (90 Base) MCG/ACT inhaler 2 puff (has no administration in time range)  methylPREDNISolone sodium succinate (SOLU-MEDROL) 40 mg/mL injection 38.4 mg (has no administration in time range)    Followed by  predniSONE (DELTASONE) tablet 50 mg (has no administration in time range)  guaiFENesin-dextromethorphan (ROBITUSSIN DM) 100-10 MG/5ML syrup 10 mL (has no administration in time range)  chlorpheniramine-HYDROcodone (TUSSIONEX) 10-8 MG/5ML suspension 5 mL (has no administration in time range)  ascorbic acid (VITAMIN C) tablet 500 mg (has no administration in time range)  zinc sulfate capsule 220 mg (220 mg Oral Given 03/29/20 1808)  acetaminophen (TYLENOL) tablet 650 mg (650 mg Oral Given 03/29/20 1807)  oxyCODONE (Oxy IR/ROXICODONE)  immediate release tablet 5 mg (has no administration in time range)  docusate sodium (  COLACE) capsule 100 mg (has no administration in time range)  polyethylene glycol (MIRALAX / GLYCOLAX) packet 17 g (has no administration in time range)  bisacodyl (DULCOLAX) EC tablet 5 mg (has no administration in time range)  sodium phosphate (FLEET) 7-19 GM/118ML enema 1 enema (has no administration in time range)  ondansetron (ZOFRAN) tablet 4 mg ( Oral See Alternative 03/29/20 1806)    Or  ondansetron (ZOFRAN) injection 4 mg (4 mg Intravenous Given 03/29/20 1806)  lactated ringers infusion (100 mL/hr Intravenous New Bag/Given 03/29/20 1812)  hydrALAZINE (APRESOLINE) injection 5 mg (has no administration in time range)  LORazepam (ATIVAN) tablet 1-4 mg (has no administration in time range)    Or  LORazepam (ATIVAN) injection 1-4 mg (has no administration in time range)  thiamine tablet 100 mg (100 mg Oral Given 03/29/20 1809)    Or  thiamine (B-1) injection 100 mg ( Intravenous See Alternative 03/29/20 1809)  folic acid (FOLVITE) tablet 1 mg (1 mg Oral Given 03/29/20 1808)  multivitamin with minerals tablet 1 tablet (1 tablet Oral Given 03/29/20 1809)  sodium chloride 0.9 % bolus 1,000 mL (0 mLs Intravenous Stopped 03/29/20 1205)  iohexol (OMNIPAQUE) 350 MG/ML injection 100 mL (100 mLs Intravenous Contrast Given 03/29/20 1130)  dexamethasone (DECADRON) injection 8 mg (8 mg Intravenous Given 03/29/20 1455)    ED Course  I have reviewed the triage vital signs and the nursing notes.  Pertinent labs & imaging results that were available during my care of the patient were reviewed by me and considered in my medical decision making (see chart for details).  54 year old presents for evaluation of CP intermittent x 2 days. Afebrile non septic, non ill appearing. Associated cough, mid SOB. Non exertional non pleuritic in nature.  Lungs clear.  He is neurovascularly intact.  No clinical evidence of DVT on  exam.  Abdomen soft, nontender without midline pulsatile abdominal mass.  Do not seem consistent with ACS or PE.  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  Does have tortuous aorta on his chest x-ray with equivocal possible infiltrate.  He did this obtain CT imaging and reassess.  Hemodynamically stable at this time WO pain, SOB.  Labs imaging personally reviewed and interpreted:  CBC Leukopenia at 2.7 BMP anion gap 16 Trop  COVID Positive DG chest with tortuous aorta, possible left perihilar infiltrate. EKG without ischemia  CT dissection study with left pulmonary nodule, recommend follow-up in 3 months, 4 cm ascending thoracic aneurysm, recommend follow-up.  Also multifocal pneumonia  Patient reassessed.  Discussed CT imaging findings and positive Covid status.  Nursing attempted to ambulate patient in which he was 88-89%.  Patient became significantly tachycardic into the 120s, 130s as well as respiratory rate into the high 30s, low 40s with small movement.  I have discussed with patient plan on 2 L via nasal cannula as he is still tachypneic in room into the high 20s, low 30s.  Will admit for Covid pneumonia.   CONSULT with Dr.Yates with [facility] who agrees to evaluate patient for admission.  The patient appears reasonably stabilized for admission considering the current resources, flow, and capabilities available in the ED at this time, and I doubt any other [facility] requiring further screening and/or treatment in the ED prior to admission.      MDM Rules/Calculators/A&P  TAYLOR LEVICK was evaluated in Emergency Department on 03/29/2020 for the symptoms described in the history of present illness. He was evaluated in the context of the global COVID-19 pandemic, which necessitated consideration that the patient might be at risk for infection with the SARS-CoV-2 virus that causes COVID-19. Institutional protocols and algorithms that pertain to  the evaluation of patients at risk for COVID-19 are in a state of rapid change based on information released by regulatory bodies including the CDC and federal and state organizations. These policies and algorithms were followed during the patient's care in the ED. Final Clinical Impression(s) / ED Diagnoses Final diagnoses:  Pneumonia due to COVID-19 virus  Thoracic aortic aneurysm without rupture [facility])  Pulmonary nodule  Atypical chest pain    Rx / DC Orders ED Discharge Orders    None       Trishia Cuthrell A, PA-C 03/29/20 1841    Alvira Monday, MD 04/02/20 [phone]

## 2020-03-30 ENCOUNTER — Encounter (HOSPITAL_COMMUNITY): Payer: Self-pay | Admitting: Internal Medicine

## 2020-03-30 DIAGNOSIS — I10 Essential (primary) hypertension: Secondary | ICD-10-CM

## 2020-03-30 DIAGNOSIS — U071 COVID-19: Secondary | ICD-10-CM | POA: Diagnosis not present

## 2020-03-30 DIAGNOSIS — F1029 Alcohol dependence with unspecified alcohol-induced disorder: Secondary | ICD-10-CM | POA: Diagnosis not present

## 2020-03-30 DIAGNOSIS — I712 Thoracic aortic aneurysm, without rupture: Secondary | ICD-10-CM | POA: Diagnosis not present

## 2020-03-30 DIAGNOSIS — R911 Solitary pulmonary nodule: Secondary | ICD-10-CM

## 2020-03-30 DIAGNOSIS — J9601 Acute respiratory failure with hypoxia: Secondary | ICD-10-CM | POA: Diagnosis not present

## 2020-03-30 DIAGNOSIS — J1282 Pneumonia due to coronavirus disease 2019: Secondary | ICD-10-CM | POA: Diagnosis not present

## 2020-03-30 LAB — C-REACTIVE PROTEIN: CRP: 1.7 mg/dL — ABNORMAL HIGH (ref <1.0)

## 2020-03-30 LAB — COMPREHENSIVE METABOLIC PANEL
ALT: 102 U/L — ABNORMAL HIGH (ref 0–44)
AST: 464 U/L — ABNORMAL HIGH (ref 15–41)
Albumin: 3 g/dL — ABNORMAL LOW (ref 3.5–5.0)
Alkaline Phosphatase: 255 U/L — ABNORMAL HIGH (ref 38–126)
Anion gap: 16 — ABNORMAL HIGH (ref 5–15)
BUN: 5 mg/dL — ABNORMAL LOW (ref 6–20)
CO2: 21 mmol/L — ABNORMAL LOW (ref 22–32)
Calcium: 8.4 mg/dL — ABNORMAL LOW (ref 8.9–10.3)
Chloride: 96 mmol/L — ABNORMAL LOW (ref 98–111)
Creatinine, Ser: 0.83 mg/dL (ref 0.61–1.24)
GFR, Estimated: >60 mL/min (ref >60)
Glucose, Bld: 94 mg/dL (ref 70–99)
Potassium: 4.3 mmol/L (ref 3.5–5.1)
Sodium: 133 mmol/L — ABNORMAL LOW (ref 135–145)
Total Bilirubin: 1.6 mg/dL — ABNORMAL HIGH (ref 0.3–1.2)
Total Protein: 6.9 g/dL (ref 6.5–8.1)

## 2020-03-30 LAB — CBC WITH DIFFERENTIAL/PLATELET
Abs Immature Granulocytes: 0.01 10*3/uL (ref 0.00–0.07)
Basophils Absolute: 0 10*3/uL (ref 0.0–0.1)
Basophils Relative: 1 %
Eosinophils Absolute: 0 10*3/uL (ref 0.0–0.5)
Eosinophils Relative: 0 %
HCT: 39.3 % (ref 39.0–52.0)
Hemoglobin: 13.6 g/dL (ref 13.0–17.0)
Immature Granulocytes: 1 %
Lymphocytes Relative: 19 %
Lymphs Abs: 0.3 10*3/uL — ABNORMAL LOW (ref 0.7–4.0)
MCH: 33.9 pg (ref 26.0–34.0)
MCHC: 34.6 g/dL (ref 30.0–36.0)
MCV: 98 fL (ref 80.0–100.0)
Monocytes Absolute: 0.1 10*3/uL (ref 0.1–1.0)
Monocytes Relative: 5 %
Neutro Abs: 1.3 10*3/uL — ABNORMAL LOW (ref 1.7–7.7)
Neutrophils Relative %: 74 %
Platelets: 113 10*3/uL — ABNORMAL LOW (ref 150–400)
RBC: 4.01 MIL/uL — ABNORMAL LOW (ref 4.22–5.81)
RDW: 13 % (ref 11.5–15.5)
WBC: 1.7 10*3/uL — ABNORMAL LOW (ref 4.0–10.5)
nRBC: 0 % (ref 0.0–0.2)

## 2020-03-30 LAB — RAPID URINE DRUG SCREEN, HOSP PERFORMED
Amphetamines: NOT DETECTED
Barbiturates: NOT DETECTED
Benzodiazepines: NOT DETECTED
Cocaine: NOT DETECTED
Opiates: NOT DETECTED
Tetrahydrocannabinol: POSITIVE — AB

## 2020-03-30 LAB — HIV ANTIBODY (ROUTINE TESTING W REFLEX): HIV Screen 4th Generation wRfx: NONREACTIVE

## 2020-03-30 LAB — PHOSPHORUS: Phosphorus: 4.6 mg/dL (ref 2.5–4.6)

## 2020-03-30 LAB — FERRITIN: Ferritin: 628 ng/mL — ABNORMAL HIGH (ref 24–336)

## 2020-03-30 LAB — MAGNESIUM: Magnesium: 1.7 mg/dL (ref 1.7–2.4)

## 2020-03-30 LAB — D-DIMER, QUANTITATIVE: D-Dimer, Quant: 0.68 ug/mL-FEU — ABNORMAL HIGH (ref 0.00–0.50)

## 2020-03-30 MED ORDER — PREDNISONE 5 MG PO TABS: 50.0000 mg | ORAL_TABLET | Freq: Every day | ORAL | Status: DC

## 2020-03-30 MED ORDER — METHYLPREDNISOLONE SODIUM SUCC 40 MG IJ SOLR
40.0000 mg | Freq: Two times a day (BID) | INTRAMUSCULAR | Status: DC
  Administered 2020-03-30 – 2020-04-01 (×4): 40 mg via INTRAVENOUS
  Filled 2020-03-30 (×4): qty 1

## 2020-03-30 MED ORDER — CHLORDIAZEPOXIDE HCL 5 MG PO CAPS
15.0000 mg | ORAL_CAPSULE | Freq: Three times a day (TID) | ORAL | Status: DC
  Administered 2020-03-30 – 2020-03-31 (×4): 15 mg via ORAL
  Filled 2020-03-30 (×4): qty 3

## 2020-03-30 MED ORDER — CARVEDILOL 6.25 MG PO TABS
6.2500 mg | ORAL_TABLET | Freq: Two times a day (BID) | ORAL | Status: DC
  Administered 2020-03-30 – 2020-04-01 (×4): 6.25 mg via ORAL
  Filled 2020-03-30 (×4): qty 1

## 2020-03-30 MED ORDER — MAGNESIUM SULFATE 2 GM/50ML IV SOLN
2.0000 g | Freq: Once | INTRAVENOUS | Status: AC
  Administered 2020-03-30: 2 g via INTRAVENOUS
  Filled 2020-03-30: qty 50

## 2020-03-30 MED ORDER — NICOTINE 21 MG/24HR TD PT24
21.0000 mg | MEDICATED_PATCH | Freq: Every day | TRANSDERMAL | Status: DC
  Administered 2020-03-30 – 2020-03-31 (×2): 21 mg via TRANSDERMAL
  Filled 2020-03-30 (×2): qty 1

## 2020-03-30 NOTE — Plan of Care (Signed)
  Problem: Education: Goal: Knowledge of risk factors and measures for prevention of condition will improve Outcome: Progressing   Problem: Coping: Goal: Psychosocial and spiritual needs will be supported Outcome: Progressing   Problem: Respiratory: Goal: Will maintain a patent airway Outcome: Progressing Goal: Complications related to the disease process, condition or treatment will be avoided or minimized Outcome: Progressing   

## 2020-03-30 NOTE — Progress Notes (Signed)
PROGRESS NOTE                                                                                                                                                                                                             Patient Demographics:    Chad Salas, is a 54 y.o. male, DOB - 1966-04-15, OMV:[id number]  Outpatient Primary MD for the patient is Patient, No Pcp Per   Admit date - 03/29/2020   LOS - 1  Chief Complaint  Patient presents with  . Chest Pain       Brief Narrative: Patient is a 54 y.o. male with PMHx of HTN, tobacco use, alcohol use-who presented with approximately 10-day history of cough, myalgias, shortness of breath.  Found to have acute hypoxic respiratory failure due to COVID-19 pneumonia.  COVID-19 vaccinated status: Unvaccinated  Significant Events: 11/29>> Admit to [facility] for hypoxia due to COVID-19  Significant studies: 11/29>> CTA chest: Patchy groundglass opacities bilaterally, right pulmonary nodule, 4 cm ascending thoracic aneurysm  COVID-19 medications: Steroids: 11/29>> Remdesivir: 11/29>>  Antibiotics: None  Microbiology data: 11/29 >>blood culture: No growth  Procedures: None  Consults: None  DVT prophylaxis: enoxaparin (LOVENOX) injection 40 mg Start: 03/29/20 1715    Subjective:    Chad Salas today is awake/alert-mildly tremulous.  Stable on just 2 L of oxygen.   Assessment  & Plan :   Acute Hypoxic Resp Failure due to Covid 19 Viral pneumonia: Appears to have mild hypoxemia-continue steroid/Remdesivir-and follow clinical course.  If patient deteriorates-we will start baricitinib given elevated LFTs.  Fever: afebrile O2 requirements:  SpO2: 97 % O2 Flow Rate (L/min): 2 L/min   COVID-19 Labs: Recent Labs    03/29/20 1433 03/30/20 0243  DDIMER 0.72* 0.68*  FERRITIN 497* 628*  LDH 315*  --   CRP 1.5* 1.7*    No results found for: BNP  Recent Labs  Lab  03/29/20 1433  PROCALCITON 0.29    Lab Results  Component Value Date   SARSCOV2NAA POSITIVE (A) 03/29/2020     Prone/Incentive Spirometry: encouraged  incentive spirometry use 3-4/hour.  Transaminitis: Suspect multifactorial etiology-from COVID-19-and also from alcoholic hepatitis.  Check acute hepatitis serology-supportive care-and follow.  Leukopenia/thrombocytopenia: Secondary to COVID-19 infection-follow for now.  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  Add scheduled Librium-continue Ativan per CIWA protocol.  HTN: BP uncontrolled-start low-dose Coreg and follow.  Tobacco abuse: We will consult the next few days  4 cm ascending thoracic aneurysm: Stable for outpatient follow-up  8 mm right lung nodule: Given smoking history-needs CT imaging in around 3 months per radiology.  Obesity: Estimated body mass index is 20.69 kg/m as calculated from the following:   Height as of this encounter:  (1.803 m).   Weight as of this encounter: 67.3 kg.    ABG: No results found for: PHART, PCO2ART, PO2ART, HCO3, TCO2, ACIDBASEDEF, O2SAT  Vent Settings: N/A  Condition - Stable  Family Communication  : Patient prefers to update family himself-I have asked him to let me know if they have any questions.  Code Status :  Full Code  Diet :  Diet Order            Diet regular Room service appropriate? Yes; Fluid consistency: Thin  Diet effective now                  Disposition Plan  :   Status is: Inpatient  Remains inpatient appropriate because:Inpatient level of care appropriate due to severity of illness   Dispo: The patient is from: Home              Anticipated d/c is to: Home              Anticipated d/c date is: > 3 days              Patient currently is not medically stable to d/c.   Barriers to discharge: Hypoxia requiring O2 supplementation/complete 5 days of IV  Remdesivir  Antimicorbials  :    Anti-infectives (From admission, onward)   Start     Dose/Rate Route Frequency Ordered Stop   03/30/20 1000  remdesivir 100 mg in sodium chloride 0.9 % 100 mL IVPB       "Followed by" Linked Group Details   100 mg 200 mL/hr over 30 Minutes Intravenous Daily 03/29/20 1712 04/03/20 0959   03/29/20 1715  remdesivir 200 mg in sodium chloride 0.9% 250 mL IVPB       "Followed by" Linked Group Details   200 mg 580 mL/hr over 30 Minutes Intravenous Once 03/29/20 1712 03/29/20 1941      Inpatient Medications  Scheduled Meds: . vitamin C  500 mg Oral Daily  . chlordiazePOXIDE  15 mg Oral TID  . docusate sodium  100 mg Oral BID  . enoxaparin (LOVENOX) injection  40 mg Subcutaneous Q24H  . folic acid  1 mg Oral Daily  . methylPREDNISolone (SOLU-MEDROL) injection  40 mg Intravenous Q12H   Followed by  . [START ON 04/02/2020] predniSONE  50 mg Oral Daily  . multivitamin with minerals  1 tablet Oral Daily  . sodium chloride flush  3 mL Intravenous Q12H  . sodium chloride flush  3 mL Intravenous Q12H  . thiamine  100 mg Oral Daily  . zinc sulfate  220 mg Oral Daily   Continuous Infusions: . sodium chloride    . remdesivir 100 mg in NS 100 mL 100 mg (03/30/20 1009)   PRN Meds:.sodium chloride, acetaminophen, albuterol, bisacodyl, chlorpheniramine-HYDROcodone, guaiFENesin-dextromethorphan, hydrALAZINE, LORazepam **OR** LORazepam, ondansetron **OR** ondansetron (ZOFRAN) IV, oxyCODONE, polyethylene glycol, sodium chloride flush, sodium phosphate   Time Spent in minutes  25  See all Orders from today for further details   Chad Salas M.D on 03/30/2020 at 11:37 AM  To page go to [email or web address] - use universal password  [facility] -  Office  [phone]    Objective:   Vitals:   03/30/20 0719 03/30/20 0722 03/30/20 0755 03/30/20 0900  BP: (!) 156/108 (!) 156/108 (!) 160/114   Pulse: 78 99 75 72  Resp: (!) 32 (!) 29 (!) 22   Temp: 98.9 F  (37.2 C) 98.9 F (37.2 C) 98.9 F (37.2 C)   TempSrc: Axillary Axillary Axillary   SpO2: 97%  97%   Weight:      Height:        Wt Readings from Last 3 Encounters:  03/30/20 67.3 kg  08/11/16 76.2 kg  02/01/16 75.8 kg     Intake/Output Summary (Last 24 hours) at 03/30/2020 1137 Last data filed at 03/30/2020 0751 Gross per 24 hour  Intake 390 ml  Output 100 ml  Net 290 ml     Physical Exam Gen Exam:Alert awake-not in any distress HEENT:atraumatic, normocephalic Chest: B/L clear to auscultation anteriorly CVS:S1S2 regular Abdomen:soft non tender, non distended Extremities:no edema Neurology: Non focal Skin: no rash   Data Review:    CBC Recent Labs  Lab 03/29/20 0921 03/30/20 0243  WBC 2.7* 1.7*  HGB 13.8 13.6  HCT 41.0 39.3  PLT 117* 113*  MCV 98.6 98.0  MCH 33.2 33.9  MCHC 33.7 34.6  RDW 13.3 13.0  LYMPHSABS  --  0.3*  MONOABS  --  0.1  EOSABS  --  0.0  BASOSABS  --  0.0    Chemistries  Recent Labs  Lab 03/29/20 0921 03/29/20 1433 03/30/20 0243  NA 136  --  133*  K 3.6  --  4.3  CL 98  --  96*  CO2 22  --  21*  GLUCOSE 81  --  94  BUN <5*  --  5*  CREATININE 0.78  --  0.83  CALCIUM 8.6*  --  8.4*  MG  --   --  1.7  AST  --  269* 464*  ALT  --  73* 102*  ALKPHOS  --  213* 255*  BILITOT  --  1.2 1.6*   ------------------------------------------------------------------------------------------------------------------ Recent Labs    03/29/20 1433  TRIG 90    No results found for: HGBA1C ------------------------------------------------------------------------------------------------------------------ No results for input(s): TSH, T4TOTAL, T3FREE, THYROIDAB in the last 72 hours.  Invalid input(s): FREET3 ------------------------------------------------------------------------------------------------------------------ Recent Labs    03/29/20 1433 03/30/20 0243  FERRITIN 497* 628*    Coagulation profile No results for input(s):  INR, PROTIME in the last 168 hours.  Recent Labs    03/29/20 1433 03/30/20 0243  DDIMER 0.72* 0.68*    Cardiac Enzymes No results for input(s): CKMB, TROPONINI, MYOGLOBIN in the last 168 hours.  Invalid input(s): CK ------------------------------------------------------------------------------------------------------------------ No results found for: BNP  Micro Results Recent Results (from the past 240 hour(s))  Resp Panel by RT-PCR (Flu A&B, Covid) Nasopharyngeal Swab     Status: Abnormal   Collection Time: 03/29/20  9:35 AM   Specimen: Nasopharyngeal Swab; Nasopharyngeal(NP) swabs in vial transport medium  Result Value Ref Range Status   SARS Coronavirus 2 by RT PCR POSITIVE (A) NEGATIVE Final    Comment: emailed L. Berdik RN 11:45 03/29/20 ([email or web address]) (NOTE) SARS-CoV-2 target nucleic acids are DETECTED.  The SARS-CoV-2 RNA is generally detectable in upper respiratory specimens during the acute phase of infection. Positive results are indicative of the presence of the identified virus, but do not rule out bacterial infection or co-infection with other pathogens  not detected by the test. Clinical correlation with patient history and other diagnostic information is necessary to determine patient infection status. The expected result is Negative.  Fact Sheet for Patients: BloggerCourse.com  Fact Sheet for Healthcare Providers: SeriousBroker.it  This test is not yet approved or cleared by the [place] FDA and  has been authorized for detection and/or diagnosis of SARS-CoV-2 by FDA under an Emergency Use Authorization (EUA).  This EUA will remain in effect (meaning this test can be used) for the duration of  the COVID- 19 declaration under Section 564(b)(1) of the Act, 21 U.S.C. section 360bbb-3(b)(1), unless the authorization is terminated or revoked sooner.     Influenza A by PCR NEGATIVE NEGATIVE Final   Influenza  B by PCR NEGATIVE NEGATIVE Final    Comment: (NOTE) The Xpert Xpress SARS-CoV-2/FLU/RSV plus assay is intended as an aid in the diagnosis of influenza from Nasopharyngeal swab specimens and should not be used as a sole basis for treatment. Nasal washings and aspirates are unacceptable for Xpert Xpress SARS-CoV-2/FLU/RSV testing.  Fact Sheet for Patients: BloggerCourse.com  Fact Sheet for Healthcare Providers: SeriousBroker.it  This test is not yet approved or cleared by the [place] FDA and has been authorized for detection and/or diagnosis of SARS-CoV-2 by FDA under an Emergency Use Authorization (EUA). This EUA will remain in effect (meaning this test can be used) for the duration of the COVID-19 declaration under Section 564(b)(1) of the Act, 21 U.S.C. section 360bbb-3(b)(1), unless the authorization is terminated or revoked.  Performed at [facility], [address], [place], [place] [address]   Blood Culture (routine x 2)     Status: None (Preliminary result)   Collection Time: 03/29/20  2:50 PM   Specimen: BLOOD LEFT ARM  Result Value Ref Range Status   Specimen Description BLOOD LEFT ARM  Final   Special Requests   Final    BOTTLES DRAWN AEROBIC AND ANAEROBIC Blood Culture adequate volume   Culture   Final    NO GROWTH < 24 HOURS Performed at [facility], [address] [place], [place] [address]    Report Status PENDING  Incomplete  Blood Culture (routine x 2)     Status: None (Preliminary result)   Collection Time: 03/29/20  5:00 PM   Specimen: BLOOD RIGHT HAND  Result Value Ref Range Status   Specimen Description BLOOD RIGHT HAND  Final   Special Requests   Final    BOTTLES DRAWN AEROBIC AND ANAEROBIC Blood Culture adequate volume   Culture   Final    NO GROWTH < 24 HOURS Performed at [facility], [address], [place], [place] [address]    Report Status PENDING  Incomplete     Radiology Reports DG Chest 2 View  Result Date: 03/29/2020 CLINICAL DATA:  Sharp left chest pain on and off for 2 days EXAM: CHEST - 2 VIEW COMPARISON:  04/04/2016 FINDINGS: Artifact from EKG pads. Equivocal indistinct opacity anterior to the left hilum. No edema, effusion, or pneumothorax. Normal heart size. Mild aortic tortuosity. IMPRESSION: Equivocal left perihilar infiltrate. Followup PA and lateral chest X-ray is recommended in 3-4 weeks to ensure resolution. Electronically Signed   By: Marnee Spring M.D.   On: 03/29/2020 07:49   CT Angio Chest/Abd/Pel for Dissection W and/or W/WO  Result Date: 03/29/2020 CLINICAL DATA:  Chest pain, back pain in a 54 year old male EXAM: CT ANGIOGRAPHY CHEST, ABDOMEN AND PELVIS TECHNIQUE: Non-contrast CT of the chest  was initially obtained. Multidetector CT imaging through the chest, abdomen and pelvis was performed using the standard protocol during bolus administration of intravenous contrast. Multiplanar reconstructed images and MIPs were obtained and reviewed to evaluate the vascular anatomy. CONTRAST:  OMNIPAQUE IOHEXOL 350 MG/ML SOLN COMPARISON:  Prior chest x-rays, no previous cross-sectional imaging of the chest. FINDINGS: CTA CHEST FINDINGS Cardiovascular: 4 cm ascending thoracic aorta. Noncontrast images without signs of periaortic stranding or intramural hematoma. Three-vessel branching pattern in the chest with patent great vessels. Sinuses of Valsalva at 4.3 cm. Aortic root at 4.0 cm. Descending thoracic aorta is nonaneurysmal. Central pulmonary vasculature unremarkable with limited assessment. Heart size mildly enlarged without pericardial effusion. Mediastinum/Nodes: Thoracic inlet structures are normal. No axillary lymphadenopathy. No mediastinal or hilar lymphadenopathy. Esophagus grossly normal. Lungs/Pleura: Patchy areas of ground-glass opacity at the periphery of the RIGHT and LEFT chest. Discrete RIGHT lower lobe pulmonary nodule  (image 140, series 7) 8 x 6 mm. Ground-glass nodule in the RIGHT lower lobe at the periphery (image 109, series 7) 7 x 7 mm. Other areas with more geographic ground-glass characteristics, for instance in the RIGHT upper lobe on image 67 of series 7 and in the lingula and LEFT upper lobe on images 81-102 of series 7. Patchy ground-glass scattered throughout the LEFT lower lobe. No dense consolidation or evidence of pleural effusion. Musculoskeletal: See below for full musculoskeletal detail. Review of the MIP images confirms the above findings. CTA ABDOMEN AND PELVIS FINDINGS VASCULAR Aorta: Normal caliber abdominal aorta without aneurysmal dilation in the abdomen. No periaortic stranding. No wall thickening. Celiac: Patent celiac axis. Hepatic arterial supply derive both from the celiac and SMA with accessory hepatic artery arising from the SMA. SMA: Patent SMA. Accessory RIGHT hepatic artery is derive from the SMA as described above. Renals: Accessory renal arteries bilaterally to the lower pole of the RIGHT and LEFT kidney. Second accessory artery on the LEFT also supplies the lower pole symmetric renal enhancement. IMA: Patent without evidence of aneurysm, dissection, vasculitis or significant stenosis. Inflow: Patent without evidence of aneurysm, dissection, vasculitis or significant stenosis. Veins: Not well evaluated but normal smooth contour of the IVC. Venous phase not obtained. Outflow: Patent into the upper thigh without aneurysmal caliber of the iliac vasculature. Review of the MIP images confirms the above findings. NON-VASCULAR Hepatobiliary: Probable hepatic steatosis. No gross lesion on arterial phase. No pericholecystic stranding. Pancreas: Normal Spleen: Normal Adrenals/Urinary Tract: Normal adrenal glands. Symmetric renal enhancement. No hydronephrosis. Possible RIGHT intrarenal calculus in the lower pole. Areas of excreted contrast however in the collecting systems Stomach/Bowel: Small hiatal  hernia. Colonic diverticulosis. No acute gastrointestinal process. Lymphatic: There is no gastrohepatic or hepatoduodenal ligament lymphadenopathy. No retroperitoneal or mesenteric lymphadenopathy. No pelvic sidewall lymphadenopathy. Reproductive: Unremarkable CT appearance of the prostate gland. Other: No ascites. Musculoskeletal: No acute bone finding. No destructive bone process. Spinal degenerative changes. Review of the MIP images confirms the above findings. IMPRESSION: 1. Patchy areas of ground-glass opacity at the periphery of the RIGHT and LEFT chest. Other areas with more geographic ground-glass characteristics. Findings are nonspecific and could be seen with multifocal pneumonia, perhaps atypical or viral. COVID-19 is considered. 2. Pulmonary nodules in the RIGHT lower lobe measuring up to 8 mm. This 8 mm nodule is solid and without signs of calcification. Could also be related to infection or inflammation though would suggest 3 month follow-up to ensure resolution given that it displays solid, noncalcified appearance and is different from other areas of opacity in  the chest. 3. Ascending thoracic aortic aneurysm at 4 cm without signs of dissection. Recommend annual imaging followup by CTA or MRA. This recommendation follows 2010 ACCF/AHA/AATS/ACR/ASA/SCA/SCAI/SIR/STS/SVM Guidelines for the Diagnosis and Management of Patients with Thoracic Aortic Disease. Circulation. 2010; 121: Z610-R604. Aortic aneurysm NOS (ICD10-I71.9) 4. No evidence of abdominal aortic aneurysm or dissection. 5. Hepatic arterial supply derived both from the celiac and SMA with accessory hepatic artery arising from the SMA. 6. Possible RIGHT intrarenal calculus in the lower pole. 7. Small hiatal hernia. 8. Colonic diverticulosis without evidence of acute gastrointestinal process. 9. Aortic atherosclerosis. These results were called by telephone at the time of interpretation on 03/29/2020 at 12:09 pm to provider Dr. Dalene Seltzer, Who  verbally acknowledged these results. Aortic Atherosclerosis (ICD10-I70.0). Electronically Signed   By: Donzetta Kohut M.D.   On: 03/29/2020 12:03

## 2020-03-31 DIAGNOSIS — J9601 Acute respiratory failure with hypoxia: Secondary | ICD-10-CM | POA: Diagnosis not present

## 2020-03-31 DIAGNOSIS — F1029 Alcohol dependence with unspecified alcohol-induced disorder: Secondary | ICD-10-CM | POA: Diagnosis not present

## 2020-03-31 DIAGNOSIS — I10 Essential (primary) hypertension: Secondary | ICD-10-CM | POA: Diagnosis not present

## 2020-03-31 DIAGNOSIS — U071 COVID-19: Secondary | ICD-10-CM | POA: Diagnosis not present

## 2020-03-31 DIAGNOSIS — I712 Thoracic aortic aneurysm, without rupture: Secondary | ICD-10-CM | POA: Diagnosis not present

## 2020-03-31 DIAGNOSIS — Z419 Encounter for procedure for purposes other than remedying health state, unspecified: Secondary | ICD-10-CM | POA: Diagnosis not present

## 2020-03-31 DIAGNOSIS — J1282 Pneumonia due to coronavirus disease 2019: Secondary | ICD-10-CM | POA: Diagnosis not present

## 2020-03-31 DIAGNOSIS — R911 Solitary pulmonary nodule: Secondary | ICD-10-CM | POA: Diagnosis not present

## 2020-03-31 LAB — CBC WITH DIFFERENTIAL/PLATELET
Abs Immature Granulocytes: 0.02 10*3/uL (ref 0.00–0.07)
Basophils Absolute: 0 10*3/uL (ref 0.0–0.1)
Basophils Relative: 0 %
Eosinophils Absolute: 0 10*3/uL (ref 0.0–0.5)
Eosinophils Relative: 0 %
HCT: 39.1 % (ref 39.0–52.0)
Hemoglobin: 13.6 g/dL (ref 13.0–17.0)
Immature Granulocytes: 1 %
Lymphocytes Relative: 30 %
Lymphs Abs: 0.4 10*3/uL — ABNORMAL LOW (ref 0.7–4.0)
MCH: 33.3 pg (ref 26.0–34.0)
MCHC: 34.8 g/dL (ref 30.0–36.0)
MCV: 95.8 fL (ref 80.0–100.0)
Monocytes Absolute: 0.2 10*3/uL (ref 0.1–1.0)
Monocytes Relative: 15 %
Neutro Abs: 0.8 10*3/uL — ABNORMAL LOW (ref 1.7–7.7)
Neutrophils Relative %: 54 %
Platelets: 124 10*3/uL — ABNORMAL LOW (ref 150–400)
RBC: 4.08 MIL/uL — ABNORMAL LOW (ref 4.22–5.81)
RDW: 12.7 % (ref 11.5–15.5)
WBC: 1.5 10*3/uL — ABNORMAL LOW (ref 4.0–10.5)
nRBC: 1.4 % — ABNORMAL HIGH (ref 0.0–0.2)

## 2020-03-31 LAB — COMPREHENSIVE METABOLIC PANEL
ALT: 117 U/L — ABNORMAL HIGH (ref 0–44)
AST: 331 U/L — ABNORMAL HIGH (ref 15–41)
Albumin: 2.9 g/dL — ABNORMAL LOW (ref 3.5–5.0)
Alkaline Phosphatase: 236 U/L — ABNORMAL HIGH (ref 38–126)
Anion gap: 13 (ref 5–15)
BUN: 6 mg/dL (ref 6–20)
CO2: 23 mmol/L (ref 22–32)
Calcium: 8.7 mg/dL — ABNORMAL LOW (ref 8.9–10.3)
Chloride: 95 mmol/L — ABNORMAL LOW (ref 98–111)
Creatinine, Ser: 0.78 mg/dL (ref 0.61–1.24)
GFR, Estimated: >60 mL/min (ref >60)
Glucose, Bld: 128 mg/dL — ABNORMAL HIGH (ref 70–99)
Potassium: 4.1 mmol/L (ref 3.5–5.1)
Sodium: 131 mmol/L — ABNORMAL LOW (ref 135–145)
Total Bilirubin: 2 mg/dL — ABNORMAL HIGH (ref 0.3–1.2)
Total Protein: 6.9 g/dL (ref 6.5–8.1)

## 2020-03-31 LAB — MAGNESIUM: Magnesium: 2.1 mg/dL (ref 1.7–2.4)

## 2020-03-31 LAB — D-DIMER, QUANTITATIVE: D-Dimer, Quant: 0.6 ug/mL-FEU — ABNORMAL HIGH (ref 0.00–0.50)

## 2020-03-31 LAB — C-REACTIVE PROTEIN: CRP: 1 mg/dL — ABNORMAL HIGH (ref <1.0)

## 2020-03-31 LAB — FERRITIN: Ferritin: 674 ng/mL — ABNORMAL HIGH (ref 24–336)

## 2020-03-31 LAB — PHOSPHORUS: Phosphorus: 2.7 mg/dL (ref 2.5–4.6)

## 2020-03-31 MED ORDER — CHLORDIAZEPOXIDE HCL 5 MG PO CAPS
10.0000 mg | ORAL_CAPSULE | Freq: Three times a day (TID) | ORAL | Status: DC
  Administered 2020-03-31 (×2): 10 mg via ORAL
  Filled 2020-03-31 (×2): qty 2

## 2020-03-31 NOTE — Progress Notes (Signed)
PROGRESS NOTE                                                                                                                                                                                                             Patient Demographics:    Chad Salas, is a 54 y.o. male, DOB - [birth year], WEX:[id number]  Outpatient Primary MD for the patient is Patient, No Pcp Per   Admit date - 03/29/2020   LOS - 2  Chief Complaint  Patient presents with  . Chest Pain       Brief Narrative: Patient is a 54 y.o. male with PMHx of HTN, tobacco use, alcohol use-who presented with approximately 10-day history of cough, myalgias, shortness of breath.  Found to have acute hypoxic respiratory failure due to COVID-19 pneumonia.  COVID-19 vaccinated status: Unvaccinated  Significant Events: 11/29>> Admit to [facility] for hypoxia due to COVID-19  Significant studies: 11/29>> CTA chest: Patchy groundglass opacities bilaterally, right pulmonary nodule, 4 cm ascending thoracic aneurysm  COVID-19 medications: Steroids: 11/29>> Remdesivir: 11/29>>  Antibiotics: None  Microbiology data: 11/29 >>blood culture: No growth  Procedures: None  Consults: None  DVT prophylaxis: enoxaparin (LOVENOX) injection 40 mg Start: 03/29/20 1715    Subjective:   Much improved minimal tremors-awake/alert.  Titrated to room air earlier this morning.    He desperately wants to go home-and was contemplating leaving AMA-I have advised him against this.  He understands that if he leaves AMA-he is at risk of life-threatening and life disabling effects.  After much discussion-he reluctantly agreed to stay 1 additional day.   Assessment  & Plan :   Acute Hypoxic Resp Failure due to Covid 19 Viral pneumonia: Improved-transitioned to room air this morning-continue steroid/Remdesivir.  If clinical improvement continues-we can contemplate discharge home  tomorrow-complete Remdesivir infusion in the outpatient setting.  Fever: afebrile O2 requirements:  SpO2: 97 % O2 Flow Rate (L/min): 2 L/min   COVID-19 Labs: Recent Labs    03/29/20 1433 03/30/20 0243 03/31/20 0129  DDIMER 0.72* 0.68* 0.60*  FERRITIN 497* 628* 674*  LDH 315*  --   --   CRP 1.5* 1.7* 1.0*    No results found for: BNP  Recent Labs  Lab 03/29/20 1433  PROCALCITON 0.29    Lab Results  Component Value Date   SARSCOV2NAA POSITIVE (A) 03/29/2020  Prone/Incentive Spirometry: encouraged  incentive spirometry use 3-4/hour.  Transaminitis: Suspect multifactorial etiology-from COVID-19-and also from alcoholic hepatitis.  T stable-infect slowly downtrending.  Leukopenia/thrombocytopenia: Secondary to COVID-19 infection-follow for now.  Alcohol withdrawal: Improving-awake/alert-Taper Librium-continue with Ativan per CIWA protocol.  Last drink was apparently on 11/28.   HTN: BP stable-continue Coreg.  Tobacco abuse: Continue transdermal nicotine-counseled.  4 cm ascending thoracic aneurysm: Stable for outpatient follow-up  8 mm right lung nodule: Given smoking history-needs CT imaging in around 3 months per radiology.  Obesity: Estimated body mass index is 19.92 kg/m as calculated from the following:   Height as of this encounter: 5\' 11"  (1.803 m).   Weight as of this encounter: 64.8 kg.    ABG: No results found for: PHART, PCO2ART, PO2ART, HCO3, TCO2, ACIDBASEDEF, O2SAT  Vent Settings: N/A  Condition - Stable  Family Communication  : Patient prefers to update family himself-I have asked him to let me know if they have any questions.  Code Status :  Full Code  Diet :  Diet Order            Diet regular Room service appropriate? Yes; Fluid consistency: Thin  Diet effective now                  Disposition Plan  :   Status is: Inpatient  Remains inpatient appropriate because:Inpatient level of care appropriate due to severity of  illness   Dispo: The patient is from: Home              Anticipated d/c is to: Home              Anticipated d/c date is: > 3 days              Patient currently is not medically stable to d/c.   Barriers to discharge: Hypoxia requiring O2 supplementation/complete 5 days of IV Remdesivir  Antimicorbials  :    Anti-infectives (From admission, onward)   Start     Dose/Rate Route Frequency Ordered Stop   03/30/20 1000  remdesivir 100 mg in sodium chloride 0.9 % 100 mL IVPB       "Followed by" Linked Group Details   100 mg 200 mL/hr over 30 Minutes Intravenous Daily 03/29/20 1712 04/03/20 0959   03/29/20 1715  remdesivir 200 mg in sodium chloride 0.9% 250 mL IVPB       "Followed by" Linked Group Details   200 mg 580 mL/hr over 30 Minutes Intravenous Once 03/29/20 1712 03/29/20 1941      Inpatient Medications  Scheduled Meds: . vitamin C  500 mg Oral Daily  . carvedilol  6.25 mg Oral BID WC  . chlordiazePOXIDE  15 mg Oral TID  . docusate sodium  100 mg Oral BID  . enoxaparin (LOVENOX) injection  40 mg Subcutaneous Q24H  . folic acid  1 mg Oral Daily  . methylPREDNISolone (SOLU-MEDROL) injection  40 mg Intravenous Q12H   Followed by  . [START ON 04/02/2020] predniSONE  50 mg Oral Daily  . multivitamin with minerals  1 tablet Oral Daily  . nicotine  21 mg Transdermal Daily  . sodium chloride flush  3 mL Intravenous Q12H  . sodium chloride flush  3 mL Intravenous Q12H  . thiamine  100 mg Oral Daily  . zinc sulfate  220 mg Oral Daily   Continuous Infusions: . sodium chloride    . remdesivir 100 mg in NS 100 mL 100 mg (03/31/20 0821)   PRN  Meds:.sodium chloride, acetaminophen, albuterol, bisacodyl, chlorpheniramine-HYDROcodone, guaiFENesin-dextromethorphan, hydrALAZINE, LORazepam **OR** LORazepam, ondansetron **OR** ondansetron (ZOFRAN) IV, oxyCODONE, polyethylene glycol, sodium chloride flush, sodium phosphate   Time Spent in minutes  25  See all Orders from today for  further details   Jeoffrey MassedShanker Carsynn Bethune M.D on 03/31/2020 at 2:03 PM  To page go to [email or web address] - use universal password  [facility] -  Office  [phone]    Objective:   Vitals:   03/31/20 0005 03/31/20 0559 03/31/20 0817 03/31/20 1252  BP: (!) 139/107 (!) 154/105 (!) 147/105 (!) 155/120  Pulse: 70 73 76 75  Resp:  (!) 21  19  Temp:  98.7 F (37.1 C)  98.6 F (37 C)  TempSrc:  Oral  Oral  SpO2:  97%    Weight:  64.8 kg    Height:        Wt Readings from Last 3 Encounters:  03/31/20 64.8 kg  08/11/16 76.2 kg  02/01/16 75.8 kg     Intake/Output Summary (Last 24 hours) at 03/31/2020 1403 Last data filed at 03/31/2020 0904 Gross per 24 hour  Intake 360 ml  Output 1475 ml  Net -1115 ml     Physical Exam Gen Exam:Alert awake-not in any distress HEENT:atraumatic, normocephalic Chest: B/L clear to auscultation anteriorly CVS:S1S2 regular Abdomen:soft non tender, non distended Extremities:no edema Neurology: Non focal Skin: no rash   Data Review:    CBC Recent Labs  Lab 03/29/20 0921 03/30/20 0243 03/31/20 0129  WBC 2.7* 1.7* 1.5*  HGB 13.8 13.6 13.6  HCT 41.0 39.3 39.1  PLT 117* 113* 124*  MCV 98.6 98.0 95.8  MCH 33.2 33.9 33.3  MCHC 33.7 34.6 34.8  RDW 13.3 13.0 12.7  LYMPHSABS  --  0.3* 0.4*  MONOABS  --  0.1 0.2  EOSABS  --  0.0 0.0  BASOSABS  --  0.0 0.0    Chemistries  Recent Labs  Lab 03/29/20 0921 03/29/20 1433 03/30/20 0243 03/31/20 0129  NA 136  --  133* 131*  K 3.6  --  4.3 4.1  CL 98  --  96* 95*  CO2 22  --  21* 23  GLUCOSE 81  --  94 128*  BUN <5*  --  5* 6  CREATININE 0.78  --  0.83 0.78  CALCIUM 8.6*  --  8.4* 8.7*  MG  --   --  1.7 2.1  AST  --  269* 464* 331*  ALT  --  73* 102* 117*  ALKPHOS  --  213* 255* 236*  BILITOT  --  1.2 1.6* 2.0*   ------------------------------------------------------------------------------------------------------------------ Recent Labs    03/29/20 1433  TRIG 90    No  results found for: HGBA1C ------------------------------------------------------------------------------------------------------------------ No results for input(s): TSH, T4TOTAL, T3FREE, THYROIDAB in the last 72 hours.  Invalid input(s): FREET3 ------------------------------------------------------------------------------------------------------------------ Recent Labs    03/30/20 0243 03/31/20 0129  FERRITIN 628* 674*    Coagulation profile No results for input(s): INR, PROTIME in the last 168 hours.  Recent Labs    03/30/20 0243 03/31/20 0129  DDIMER 0.68* 0.60*    Cardiac Enzymes No results for input(s): CKMB, TROPONINI, MYOGLOBIN in the last 168 hours.  Invalid input(s): CK ------------------------------------------------------------------------------------------------------------------ No results found for: BNP  Micro Results Recent Results (from the past 240 hour(s))  Resp Panel by RT-PCR (Flu A&B, Covid) Nasopharyngeal Swab     Status: Abnormal   Collection Time: 03/29/20  9:35 AM   Specimen: Nasopharyngeal Swab; Nasopharyngeal(NP) swabs in vial  transport medium  Result Value Ref Range Status   SARS Coronavirus 2 by RT PCR POSITIVE (A) NEGATIVE Final    Comment: emailed L. Berdik RN 11:45 03/29/20 ([email or web address]) (NOTE) SARS-CoV-2 target nucleic acids are DETECTED.  The SARS-CoV-2 RNA is generally detectable in upper respiratory specimens during the acute phase of infection. Positive results are indicative of the presence of the identified virus, but do not rule out bacterial infection or co-infection with other pathogens not detected by the test. Clinical correlation with patient history and other diagnostic information is necessary to determine patient infection status. The expected result is Negative.  Fact Sheet for Patients: BloggerCourse.com  Fact Sheet for Healthcare Providers: SeriousBroker.it  This test  is not yet approved or cleared by the [place] FDA and  has been authorized for detection and/or diagnosis of SARS-CoV-2 by FDA under an Emergency Use Authorization (EUA).  This EUA will remain in effect (meaning this test can be used) for the duration of  the COVID- 19 declaration under Section 564(b)(1) of the Act, 21 U.S.C. section 360bbb-3(b)(1), unless the authorization is terminated or revoked sooner.     Influenza A by PCR NEGATIVE NEGATIVE Final   Influenza B by PCR NEGATIVE NEGATIVE Final    Comment: (NOTE) The Xpert Xpress SARS-CoV-2/FLU/RSV plus assay is intended as an aid in the diagnosis of influenza from Nasopharyngeal swab specimens and should not be used as a sole basis for treatment. Nasal washings and aspirates are unacceptable for Xpert Xpress SARS-CoV-2/FLU/RSV testing.  Fact Sheet for Patients: BloggerCourse.com  Fact Sheet for Healthcare Providers: SeriousBroker.it  This test is not yet approved or cleared by the [place] FDA and has been authorized for detection and/or diagnosis of SARS-CoV-2 by FDA under an Emergency Use Authorization (EUA). This EUA will remain in effect (meaning this test can be used) for the duration of the COVID-19 declaration under Section 564(b)(1) of the Act, 21 U.S.C. section 360bbb-3(b)(1), unless the authorization is terminated or revoked.  Performed at [facility], [address] [place], [place] [address]   Blood Culture (routine x 2)     Status: None (Preliminary result)   Collection Time: 03/29/20  2:50 PM   Specimen: BLOOD LEFT ARM  Result Value Ref Range Status   Specimen Description BLOOD LEFT ARM  Final   Special Requests   Final    BOTTLES DRAWN AEROBIC AND ANAEROBIC Blood Culture adequate volume   Culture   Final    NO GROWTH 2 DAYS Performed at [facility], [address], [place], [place] [address]    Report Status PENDING  Incomplete    Blood Culture (routine x 2)     Status: None (Preliminary result)   Collection Time: 03/29/20  5:00 PM   Specimen: BLOOD RIGHT HAND  Result Value Ref Range Status   Specimen Description BLOOD RIGHT HAND  Final   Special Requests   Final    BOTTLES DRAWN AEROBIC AND ANAEROBIC Blood Culture adequate volume   Culture   Final    NO GROWTH 2 DAYS Performed at [facility], [address], [place], [place] [address]    Report Status PENDING  Incomplete    Radiology Reports DG Chest 2 View  Result Date: 03/29/2020 CLINICAL DATA:  Sharp left chest pain on and off for 2 days EXAM: CHEST - 2 VIEW COMPARISON:  04/04/2016 FINDINGS: Artifact from EKG pads. Equivocal indistinct opacity anterior to the left hilum. No edema, effusion, or pneumothorax.  Normal heart size. Mild aortic tortuosity. IMPRESSION: Equivocal left perihilar infiltrate. Followup PA and lateral chest X-ray is recommended in 3-4 weeks to ensure resolution. Electronically Signed   By: Marnee Spring M.D.   On: 03/29/2020 07:49   CT Angio Chest/Abd/Pel for Dissection W and/or W/WO  Result Date: 03/29/2020 CLINICAL DATA:  Chest pain, back pain in a 54 year old male EXAM: CT ANGIOGRAPHY CHEST, ABDOMEN AND PELVIS TECHNIQUE: Non-contrast CT of the chest was initially obtained. Multidetector CT imaging through the chest, abdomen and pelvis was performed using the standard protocol during bolus administration of intravenous contrast. Multiplanar reconstructed images and MIPs were obtained and reviewed to evaluate the vascular anatomy. CONTRAST:  OMNIPAQUE IOHEXOL 350 MG/ML SOLN COMPARISON:  Prior chest x-rays, no previous cross-sectional imaging of the chest. FINDINGS: CTA CHEST FINDINGS Cardiovascular: 4 cm ascending thoracic aorta. Noncontrast images without signs of periaortic stranding or intramural hematoma. Three-vessel branching pattern in the chest with patent great vessels. Sinuses of Valsalva at 4.3 cm. Aortic root at 4.0  cm. Descending thoracic aorta is nonaneurysmal. Central pulmonary vasculature unremarkable with limited assessment. Heart size mildly enlarged without pericardial effusion. Mediastinum/Nodes: Thoracic inlet structures are normal. No axillary lymphadenopathy. No mediastinal or hilar lymphadenopathy. Esophagus grossly normal. Lungs/Pleura: Patchy areas of ground-glass opacity at the periphery of the RIGHT and LEFT chest. Discrete RIGHT lower lobe pulmonary nodule (image 140, series 7) 8 x 6 mm. Ground-glass nodule in the RIGHT lower lobe at the periphery (image 109, series 7) 7 x 7 mm. Other areas with more geographic ground-glass characteristics, for instance in the RIGHT upper lobe on image 67 of series 7 and in the lingula and LEFT upper lobe on images 81-102 of series 7. Patchy ground-glass scattered throughout the LEFT lower lobe. No dense consolidation or evidence of pleural effusion. Musculoskeletal: See below for full musculoskeletal detail. Review of the MIP images confirms the above findings. CTA ABDOMEN AND PELVIS FINDINGS VASCULAR Aorta: Normal caliber abdominal aorta without aneurysmal dilation in the abdomen. No periaortic stranding. No wall thickening. Celiac: Patent celiac axis. Hepatic arterial supply derive both from the celiac and SMA with accessory hepatic artery arising from the SMA. SMA: Patent SMA. Accessory RIGHT hepatic artery is derive from the SMA as described above. Renals: Accessory renal arteries bilaterally to the lower pole of the RIGHT and LEFT kidney. Second accessory artery on the LEFT also supplies the lower pole symmetric renal enhancement. IMA: Patent without evidence of aneurysm, dissection, vasculitis or significant stenosis. Inflow: Patent without evidence of aneurysm, dissection, vasculitis or significant stenosis. Veins: Not well evaluated but normal smooth contour of the IVC. Venous phase not obtained. Outflow: Patent into the upper thigh without aneurysmal caliber of the  iliac vasculature. Review of the MIP images confirms the above findings. NON-VASCULAR Hepatobiliary: Probable hepatic steatosis. No gross lesion on arterial phase. No pericholecystic stranding. Pancreas: Normal Spleen: Normal Adrenals/Urinary Tract: Normal adrenal glands. Symmetric renal enhancement. No hydronephrosis. Possible RIGHT intrarenal calculus in the lower pole. Areas of excreted contrast however in the collecting systems Stomach/Bowel: Small hiatal hernia. Colonic diverticulosis. No acute gastrointestinal process. Lymphatic: There is no gastrohepatic or hepatoduodenal ligament lymphadenopathy. No retroperitoneal or mesenteric lymphadenopathy. No pelvic sidewall lymphadenopathy. Reproductive: Unremarkable CT appearance of the prostate gland. Other: No ascites. Musculoskeletal: No acute bone finding. No destructive bone process. Spinal degenerative changes. Review of the MIP images confirms the above findings. IMPRESSION: 1. Patchy areas of ground-glass opacity at the periphery of the RIGHT and LEFT chest. Other areas with more  geographic ground-glass characteristics. Findings are nonspecific and could be seen with multifocal pneumonia, perhaps atypical or viral. COVID-19 is considered. 2. Pulmonary nodules in the RIGHT lower lobe measuring up to 8 mm. This 8 mm nodule is solid and without signs of calcification. Could also be related to infection or inflammation though would suggest 3 month follow-up to ensure resolution given that it displays solid, noncalcified appearance and is different from other areas of opacity in the chest. 3. Ascending thoracic aortic aneurysm at 4 cm without signs of dissection. Recommend annual imaging followup by CTA or MRA. This recommendation follows 2010 ACCF/AHA/AATS/ACR/ASA/SCA/SCAI/SIR/STS/SVM Guidelines for the Diagnosis and Management of Patients with Thoracic Aortic Disease. Circulation. 2010; 121: Z610-R604. Aortic aneurysm NOS (ICD10-I71.9) 4. No evidence of  abdominal aortic aneurysm or dissection. 5. Hepatic arterial supply derived both from the celiac and SMA with accessory hepatic artery arising from the SMA. 6. Possible RIGHT intrarenal calculus in the lower pole. 7. Small hiatal hernia. 8. Colonic diverticulosis without evidence of acute gastrointestinal process. 9. Aortic atherosclerosis. These results were called by telephone at the time of interpretation on 03/29/2020 at 12:09 pm to provider Dr. Dalene Seltzer, Who verbally acknowledged these results. Aortic Atherosclerosis (ICD10-I70.0). Electronically Signed   By: Donzetta Kohut M.D.   On: 03/29/2020 12:03

## 2020-04-01 ENCOUNTER — Other Ambulatory Visit (HOSPITAL_COMMUNITY): Payer: Self-pay | Admitting: Internal Medicine

## 2020-04-01 DIAGNOSIS — U071 COVID-19: Secondary | ICD-10-CM | POA: Diagnosis not present

## 2020-04-01 DIAGNOSIS — J9601 Acute respiratory failure with hypoxia: Secondary | ICD-10-CM | POA: Diagnosis not present

## 2020-04-01 DIAGNOSIS — I10 Essential (primary) hypertension: Secondary | ICD-10-CM | POA: Diagnosis not present

## 2020-04-01 DIAGNOSIS — J1282 Pneumonia due to coronavirus disease 2019: Secondary | ICD-10-CM

## 2020-04-01 DIAGNOSIS — R911 Solitary pulmonary nodule: Secondary | ICD-10-CM | POA: Diagnosis not present

## 2020-04-01 DIAGNOSIS — F1029 Alcohol dependence with unspecified alcohol-induced disorder: Secondary | ICD-10-CM | POA: Diagnosis not present

## 2020-04-01 DIAGNOSIS — I712 Thoracic aortic aneurysm, without rupture: Secondary | ICD-10-CM

## 2020-04-01 LAB — COMPREHENSIVE METABOLIC PANEL
ALT: 119 U/L — ABNORMAL HIGH (ref 0–44)
AST: 297 U/L — ABNORMAL HIGH (ref 15–41)
Albumin: 2.9 g/dL — ABNORMAL LOW (ref 3.5–5.0)
Alkaline Phosphatase: 242 U/L — ABNORMAL HIGH (ref 38–126)
Anion gap: 13 (ref 5–15)
BUN: 11 mg/dL (ref 6–20)
CO2: 24 mmol/L (ref 22–32)
Calcium: 9.1 mg/dL (ref 8.9–10.3)
Chloride: 97 mmol/L — ABNORMAL LOW (ref 98–111)
Creatinine, Ser: 0.85 mg/dL (ref 0.61–1.24)
GFR, Estimated: >60 mL/min (ref >60)
Glucose, Bld: 147 mg/dL — ABNORMAL HIGH (ref 70–99)
Potassium: 4 mmol/L (ref 3.5–5.1)
Sodium: 134 mmol/L — ABNORMAL LOW (ref 135–145)
Total Bilirubin: 1.8 mg/dL — ABNORMAL HIGH (ref 0.3–1.2)
Total Protein: 6.8 g/dL (ref 6.5–8.1)

## 2020-04-01 LAB — CBC WITH DIFFERENTIAL/PLATELET
Abs Immature Granulocytes: 0.01 10*3/uL (ref 0.00–0.07)
Basophils Absolute: 0 10*3/uL (ref 0.0–0.1)
Basophils Relative: 0 %
Eosinophils Absolute: 0 10*3/uL (ref 0.0–0.5)
Eosinophils Relative: 0 %
HCT: 37.9 % — ABNORMAL LOW (ref 39.0–52.0)
Hemoglobin: 13.3 g/dL (ref 13.0–17.0)
Immature Granulocytes: 0 %
Lymphocytes Relative: 16 %
Lymphs Abs: 0.5 10*3/uL — ABNORMAL LOW (ref 0.7–4.0)
MCH: 33.9 pg (ref 26.0–34.0)
MCHC: 35.1 g/dL (ref 30.0–36.0)
MCV: 96.7 fL (ref 80.0–100.0)
Monocytes Absolute: 0.2 10*3/uL (ref 0.1–1.0)
Monocytes Relative: 8 %
Neutro Abs: 2.1 10*3/uL (ref 1.7–7.7)
Neutrophils Relative %: 76 %
Platelets: 129 10*3/uL — ABNORMAL LOW (ref 150–400)
RBC: 3.92 MIL/uL — ABNORMAL LOW (ref 4.22–5.81)
RDW: 12.9 % (ref 11.5–15.5)
WBC: 2.8 10*3/uL — ABNORMAL LOW (ref 4.0–10.5)
nRBC: 1.4 % — ABNORMAL HIGH (ref 0.0–0.2)

## 2020-04-01 LAB — FERRITIN: Ferritin: 610 ng/mL — ABNORMAL HIGH (ref 24–336)

## 2020-04-01 LAB — C-REACTIVE PROTEIN: CRP: 0.9 mg/dL (ref <1.0)

## 2020-04-01 LAB — PHOSPHORUS: Phosphorus: 2 mg/dL — ABNORMAL LOW (ref 2.5–4.6)

## 2020-04-01 LAB — D-DIMER, QUANTITATIVE: D-Dimer, Quant: 0.53 ug/mL-FEU — ABNORMAL HIGH (ref 0.00–0.50)

## 2020-04-01 LAB — MAGNESIUM: Magnesium: 2.1 mg/dL (ref 1.7–2.4)

## 2020-04-01 MED ORDER — BENZONATATE 100 MG PO CAPS: 100.0000 mg | ORAL_CAPSULE | Freq: Four times a day (QID) | ORAL | 0 refills | Status: AC | PRN

## 2020-04-01 MED ORDER — THIAMINE HCL 100 MG PO TABS
100.0000 mg | ORAL_TABLET | Freq: Every day | ORAL | 0 refills | Status: DC
Start: 2020-04-02 — End: 2023-04-12

## 2020-04-01 MED ORDER — PREDNISONE 10 MG PO TABS: ORAL_TABLET | ORAL | 0 refills | Status: DC

## 2020-04-01 MED ORDER — K PHOS MONO-SOD PHOS DI & MONO 155-852-130 MG PO TABS
500.0000 mg | ORAL_TABLET | Freq: Two times a day (BID) | ORAL | Status: DC
  Administered 2020-04-01: 500 mg via ORAL
  Filled 2020-04-01: qty 2

## 2020-04-01 MED ORDER — CHLORDIAZEPOXIDE HCL 5 MG PO CAPS
5.0000 mg | ORAL_CAPSULE | Freq: Two times a day (BID) | ORAL | Status: DC
  Administered 2020-04-01: 5 mg via ORAL
  Filled 2020-04-01: qty 1

## 2020-04-01 MED ORDER — ADULT MULTIVITAMIN W/MINERALS CH: 1.0000 | ORAL_TABLET | Freq: Every day | ORAL | 0 refills | Status: DC

## 2020-04-01 MED ORDER — ALBUTEROL SULFATE HFA 108 (90 BASE) MCG/ACT IN AERS: 2.0000 | INHALATION_SPRAY | RESPIRATORY_TRACT | 0 refills | Status: DC | PRN

## 2020-04-01 MED ORDER — CARVEDILOL 6.25 MG PO TABS: 6.2500 mg | ORAL_TABLET | Freq: Two times a day (BID) | ORAL | 0 refills | Status: DC

## 2020-04-01 MED FILL — VITAMIN B-1 100 MG TABS: 100 | 30 days supply | Qty: 30 | Fill #0

## 2020-04-01 MED FILL — predniSONE 10 MG TABS: 10 | 7 days supply | Qty: 19 | Fill #0

## 2020-04-01 MED FILL — PROAIR HFA 90 MCG INHALER: 108 (90 BAS | 8 days supply | Qty: 9 | Fill #0

## 2020-04-01 MED FILL — CARVEDILOL 6.25 MG TABLET: 6.25 | 30 days supply | Qty: 60 | Fill #0

## 2020-04-01 MED FILL — BENZONATATE 100 MG CAP: 100 | 7 days supply | Qty: 30 | Fill #0

## 2020-04-01 NOTE — Progress Notes (Signed)
Pt removed tele patches and pulse ox stated that he did not need them. This RN asked pt if tele and pulse ox can be placed back on; pt refused. On call MD  and CCMD notified.

## 2020-04-01 NOTE — Progress Notes (Signed)
Patient scheduled for outpatient Remdesivir infusions at 1pm on Friday 12/3 at [facility]. Please inform the patient to park at [address] [place], [place], as staff will be escorting the patient through the east entrance of the hospital. Appointments take approximately 45 minutes.    There is a wave flag banner located near the entrance on [address]. Turn into this entrance and immediately turn left or right and park in 1 of the 10 designated Covid Infusion Parking spots. There is a phone number on the sign, please call and let the staff know what spot you are in and we will come out and get you. For questions call [phone].  Thanks.

## 2020-04-01 NOTE — Discharge Instructions (Signed)
Patient scheduled for outpatient Remdesivir infusions at 1pm on Friday 12/3 at [facility]. Please inform the patient to park at [address] [place], [place], as staff will be escorting the patient through the east entrance of the hospital. Appointments take approximately 45 minutes.    There is a wave flag banner located near the entrance on [address]. Turn into this entrance and immediately turn left or right and park in 1 of the 10 designated Covid Infusion Parking spots. There is a phone number on the sign, please call and let the staff know what spot you are in and we will come out and get you. For questions call [phone].  Thanks.      Person Under Monitoring Name: Chad Salas  Location: [address] [place] [address]   Infection Prevention Recommendations for Individuals Confirmed to have, or Being Evaluated for, 2019 Novel Coronavirus (COVID-19) Infection Who Receive Care at Home  Individuals who are confirmed to have, or are being evaluated for, COVID-19 should follow the prevention steps below until a healthcare provider or local or state health department says they can return to normal activities.  Stay home except to get medical care You should restrict activities outside your home, except for getting medical care. Do not go to work, school, or public areas, and do not use public transportation or taxis.  Call ahead before visiting your doctor Before your medical appointment, call the healthcare provider and tell them that you have, or are being evaluated for, COVID-19 infection. This will help the healthcare provider's office take steps to keep other people from getting infected. Ask your healthcare provider to call the local or state health department.  Monitor your symptoms Seek prompt medical attention if your illness is worsening (e.g., difficulty breathing). Before going to your medical appointment, call the healthcare provider and tell them that  you have, or are being evaluated for, COVID-19 infection. Ask your healthcare provider to call the local or state health department.  Wear a facemask You should wear a facemask that covers your nose and mouth when you are in the same room with other people and when you visit a healthcare provider. People who live with or visit you should also wear a facemask while they are in the same room with you.  Separate yourself from other people in your home As much as possible, you should stay in a different room from other people in your home. Also, you should use a separate bathroom, if available.  Avoid sharing household items You should not share dishes, drinking glasses, cups, eating utensils, towels, bedding, or other items with other people in your home. After using these items, you should wash them thoroughly with soap and water.  Cover your coughs and sneezes Cover your mouth and nose with a tissue when you cough or sneeze, or you can cough or sneeze into your sleeve. Throw used tissues in a lined trash can, and immediately wash your hands with soap and water for at least 20 seconds or use an alcohol-based hand rub.  Wash your [employer] your hands often and thoroughly with soap and water for at least 20 seconds. You can use an alcohol-based hand sanitizer if soap and water are not available and if your hands are not visibly dirty. Avoid touching your eyes, nose, and mouth with unwashed hands.   Prevention Steps for Caregivers and Household Members of Individuals Confirmed to have, or Being Evaluated for, COVID-19 Infection Being Cared  for in the Home  If you live with, or provide care at home for, a person confirmed to have, or being evaluated for, COVID-19 infection please follow these guidelines to prevent infection:  Follow healthcare provider's instructions Make sure that you understand and can help the patient follow any healthcare provider instructions for all  care.  Provide for the patient's basic needs You should help the patient with basic needs in the home and provide support for getting groceries, prescriptions, and other personal needs.  Monitor the patient's symptoms If they are getting sicker, call his or her medical provider and tell them that the patient has, or is being evaluated for, COVID-19 infection. This will help the healthcare provider's office take steps to keep other people from getting infected. Ask the healthcare provider to call the local or state health department.  Limit the number of people who have contact with the patient  If possible, have only one caregiver for the patient.  Other household members should stay in another home or place of residence. If this is not possible, they should stay  in another room, or be separated from the patient as much as possible. Use a separate bathroom, if available.  Restrict visitors who do not have an essential need to be in the home.  Keep older adults, very young children, and other sick people away from the patient Keep older adults, very young children, and those who have compromised immune systems or chronic health conditions away from the patient. This includes people with chronic heart, lung, or kidney conditions, diabetes, and cancer.  Ensure good ventilation Make sure that shared spaces in the home have good air flow, such as from an air conditioner or an opened window, weather permitting.  Wash your hands often  Wash your hands often and thoroughly with soap and water for at least 20 seconds. You can use an alcohol based hand sanitizer if soap and water are not available and if your hands are not visibly dirty.  Avoid touching your eyes, nose, and mouth with unwashed hands.  Use disposable paper towels to dry your hands. If not available, use dedicated cloth towels and replace them when they become wet.  Wear a facemask and gloves  Wear a disposable facemask at  all times in the room and gloves when you touch or have contact with the patient's blood, body fluids, and/or secretions or excretions, such as sweat, saliva, sputum, nasal mucus, vomit, urine, or feces.  Ensure the mask fits over your nose and mouth tightly, and do not touch it during use.  Throw out disposable facemasks and gloves after using them. Do not reuse.  Wash your hands immediately after removing your facemask and gloves.  If your personal clothing becomes contaminated, carefully remove clothing and launder. Wash your hands after handling contaminated clothing.  Place all used disposable facemasks, gloves, and other waste in a lined container before disposing them with other household waste.  Remove gloves and wash your hands immediately after handling these items.  Do not share dishes, glasses, or other household items with the patient  Avoid sharing household items. You should not share dishes, drinking glasses, cups, eating utensils, towels, bedding, or other items with a patient who is confirmed to have, or being evaluated for, COVID-19 infection.  After the person uses these items, you should wash them thoroughly with soap and water.  Wash laundry thoroughly  Immediately remove and wash clothes or bedding that have blood, body fluids, and/or secretions  or excretions, such as sweat, saliva, sputum, nasal mucus, vomit, urine, or feces, on them.  Wear gloves when handling laundry from the patient.  Read and follow directions on labels of laundry or clothing items and detergent. In general, wash and dry with the warmest temperatures recommended on the label.  Clean all areas the individual has used often  Clean all touchable surfaces, such as counters, tabletops, doorknobs, bathroom fixtures, toilets, phones, keyboards, tablets, and bedside tables, every day. Also, clean any surfaces that may have blood, body fluids, and/or secretions or excretions on them.  Wear gloves when  cleaning surfaces the patient has come in contact with.  Use a diluted bleach solution (e.g., dilute bleach with 1 part bleach and 10 parts water) or a household disinfectant with a label that says EPA-registered for coronaviruses. To make a bleach solution at home, add 1 tablespoon of bleach to 1 quart (4 cups) of water. For a larger supply, add  cup of bleach to 1 gallon (16 cups) of water.  Read labels of cleaning products and follow recommendations provided on product labels. Labels contain instructions for safe and effective use of the cleaning product including precautions you should take when applying the product, such as wearing gloves or eye protection and making sure you have good ventilation during use of the product.  Remove gloves and wash hands immediately after cleaning.  Monitor yourself for signs and symptoms of illness Caregivers and household members are considered close contacts, should monitor their health, and will be asked to limit movement outside of the home to the extent possible. Follow the monitoring steps for close contacts listed on the symptom monitoring form.   ? If you have additional questions, contact your local health department or call the epidemiologist on call at [phone] (available 24/7). ? This guidance is subject to change. For the most up-to-date guidance from [facility], please refer to their website: [email or web address]

## 2020-04-01 NOTE — Discharge Summary (Signed)
PATIENT DETAILS Name: Chad Salas Age: 54 y.o. Sex: male Date of Birth: August 24, 1965 MRN: [id number]. Admitting Physician: Jonah Blue, MD WUJ:[id number], No Pcp Per  Admit Date: 03/29/2020 Discharge date: 04/01/2020  Recommendations for Outpatient Follow-up:  1. Follow up with PCP in 1-2 weeks 2. Please obtain CMP/CBC in one week 3. Repeat Chest Xray in 4-6 week 4. Continue counseling regarding importance of completely stopping EtOH use 5. Needs surveillance/monitoring of lung nodule and ascending aortic aneurysm-see below  Admitted From:  Home  Disposition: Home   Home Health: No  Equipment/Devices: None  Discharge Condition: Stable  CODE STATUS: FULL CODE  Diet recommendation:  Diet Order            Diet - low sodium heart healthy           Diet regular Room service appropriate? Yes; Fluid consistency: Thin  Diet effective now                 Brief Narrative: Patient is a 54 y.o. male with PMHx of HTN, tobacco use, alcohol use-who presented with approximately 10-day history of cough, myalgias, shortness of breath.  Found to have acute hypoxic respiratory failure due to COVID-19 pneumonia.  COVID-19 vaccinated status: Unvaccinated  Significant Events: 11/29>> Admit to [facility] for hypoxia due to COVID-19  Significant studies: 11/29>> CTA chest: Patchy groundglass opacities bilaterally, right pulmonary nodule, 4 cm ascending thoracic aneurysm  COVID-19 medications: Steroids: 11/29>> Remdesivir: 11/29>>  Antibiotics: None  Microbiology data: 11/29 >>blood culture: No growth  Procedures: None  Consults: None  Brief Hospital Course: Acute Hypoxic Resp Failure due to Covid 19 Viral pneumonia: Improved-transitioned to room air on 12/1-remains on room air on 12/2.  Clinically improved-we will complete fourth dose of Remdesivir today-and will follow at the Remdesivir infusion center on 12/3 for his final dose of Remdesivir.  We will continue  with tapering steroids on discharge.  Patient is aware that if he develops worsening shortness of breath he needs to seek immediate medical attention   Note-patient has been insisting on going home since yesterday-today he has already put his clothes on-and is threatening to sign out AMA.  COVID-19 Labs:  Recent Labs    03/29/20 1433 03/29/20 1433 03/30/20 0243 03/31/20 0129 04/01/20 0119  DDIMER 0.72*   < > 0.68* 0.60* 0.53*  FERRITIN 497*   < > 628* 674* 610*  LDH 315*  --   --   --   --   CRP 1.5*   < > 1.7* 1.0* 0.9   < > = values in this interval not displayed.    Lab Results  Component Value Date   SARSCOV2NAA POSITIVE (A) 03/29/2020    Transaminitis: Suspect multifactorial etiology-from COVID-19-and also from alcoholic hepatitis.  LFTs downtrending-stable for follow-up with PCP.  Avoid alcohol use-he has been counseled extensively.  Leukopenia/thrombocytopenia: Secondary to COVID-19 infection-follow closely by PCP.  Alcohol withdrawal: Significantly better-Librium being tapered off today-was managed with Ativan per CIWA protocol as well.  Last drink was on 11/28.  I have counseled him extensively-although I doubt he has any intention of quitting.  HTN: BP stable-continue Coreg.  Tobacco abuse: Continue transdermal nicotine-counseled.  4 cm ascending thoracic aneurysm: Stable for outpatient follow-up  8 mm right lung nodule: Given smoking history-needs CT imaging in around 3 months per radiology.  Discharge Diagnoses:  Principal Problem:   Acute hypoxemic respiratory failure due to COVID-19 [facility]) Active Problems:   Alcohol dependence (HCC)   Hypertension  Tobacco dependence   Thoracic aortic aneurysm [facility])   Discharge Instructions:    Person Under Monitoring Name: Chad Salas  Location: [address] [place] [address]   Infection Prevention Recommendations for Individuals Confirmed to have, or Being Evaluated for, 2019 Novel Coronavirus  (COVID-19) Infection Who Receive Care at Home  Individuals who are confirmed to have, or are being evaluated for, COVID-19 should follow the prevention steps below until a healthcare provider or local or state health department says they can return to normal activities.  Stay home except to get medical care You should restrict activities outside your home, except for getting medical care. Do not go to work, school, or public areas, and do not use public transportation or taxis.  Call ahead before visiting your doctor Before your medical appointment, call the healthcare provider and tell them that you have, or are being evaluated for, COVID-19 infection. This will help the healthcare provider's office take steps to keep other people from getting infected. Ask your healthcare provider to call the local or state health department.  Monitor your symptoms Seek prompt medical attention if your illness is worsening (e.g., difficulty breathing). Before going to your medical appointment, call the healthcare provider and tell them that you have, or are being evaluated for, COVID-19 infection. Ask your healthcare provider to call the local or state health department.  Wear a facemask You should wear a facemask that covers your nose and mouth when you are in the same room with other people and when you visit a healthcare provider. People who live with or visit you should also wear a facemask while they are in the same room with you.  Separate yourself from other people in your home As much as possible, you should stay in a different room from other people in your home. Also, you should use a separate bathroom, if available.  Avoid sharing household items You should not share dishes, drinking glasses, cups, eating utensils, towels, bedding, or other items with other people in your home. After using these items, you should wash them thoroughly with soap and water.  Cover your coughs and  sneezes Cover your mouth and nose with a tissue when you cough or sneeze, or you can cough or sneeze into your sleeve. Throw used tissues in a lined trash can, and immediately wash your hands with soap and water for at least 20 seconds or use an alcohol-based hand rub.  Wash your [employer] your hands often and thoroughly with soap and water for at least 20 seconds. You can use an alcohol-based hand sanitizer if soap and water are not available and if your hands are not visibly dirty. Avoid touching your eyes, nose, and mouth with unwashed hands.   Prevention Steps for Caregivers and Household Members of Individuals Confirmed to have, or Being Evaluated for, COVID-19 Infection Being Cared for in the Home  If you live with, or provide care at home for, a person confirmed to have, or being evaluated for, COVID-19 infection please follow these guidelines to prevent infection:  Follow healthcare provider's instructions Make sure that you understand and can help the patient follow any healthcare provider instructions for all care.  Provide for the patient's basic needs You should help the patient with basic needs in the home and provide support for getting groceries, prescriptions, and other personal needs.  Monitor the patient's symptoms If they are getting sicker, call his or her medical provider and tell them that the patient has,  or is being evaluated for, COVID-19 infection. This will help the healthcare provider's office take steps to keep other people from getting infected. Ask the healthcare provider to call the local or state health department.  Limit the number of people who have contact with the patient  If possible, have only one caregiver for the patient.  Other household members should stay in another home or place of residence. If this is not possible, they should stay  in another room, or be separated from the patient as much as possible. Use a separate bathroom, if  available.  Restrict visitors who do not have an essential need to be in the home.  Keep older adults, very young children, and other sick people away from the patient Keep older adults, very young children, and those who have compromised immune systems or chronic health conditions away from the patient. This includes people with chronic heart, lung, or kidney conditions, diabetes, and cancer.  Ensure good ventilation Make sure that shared spaces in the home have good air flow, such as from an air conditioner or an opened window, weather permitting.  Wash your hands often  Wash your hands often and thoroughly with soap and water for at least 20 seconds. You can use an alcohol based hand sanitizer if soap and water are not available and if your hands are not visibly dirty.  Avoid touching your eyes, nose, and mouth with unwashed hands.  Use disposable paper towels to dry your hands. If not available, use dedicated cloth towels and replace them when they become wet.  Wear a facemask and gloves  Wear a disposable facemask at all times in the room and gloves when you touch or have contact with the patient's blood, body fluids, and/or secretions or excretions, such as sweat, saliva, sputum, nasal mucus, vomit, urine, or feces.  Ensure the mask fits over your nose and mouth tightly, and do not touch it during use.  Throw out disposable facemasks and gloves after using them. Do not reuse.  Wash your hands immediately after removing your facemask and gloves.  If your personal clothing becomes contaminated, carefully remove clothing and launder. Wash your hands after handling contaminated clothing.  Place all used disposable facemasks, gloves, and other waste in a lined container before disposing them with other household waste.  Remove gloves and wash your hands immediately after handling these items.  Do not share dishes, glasses, or other household items with the patient  Avoid sharing  household items. You should not share dishes, drinking glasses, cups, eating utensils, towels, bedding, or other items with a patient who is confirmed to have, or being evaluated for, COVID-19 infection.  After the person uses these items, you should wash them thoroughly with soap and water.  Wash laundry thoroughly  Immediately remove and wash clothes or bedding that have blood, body fluids, and/or secretions or excretions, such as sweat, saliva, sputum, nasal mucus, vomit, urine, or feces, on them.  Wear gloves when handling laundry from the patient.  Read and follow directions on labels of laundry or clothing items and detergent. In general, wash and dry with the warmest temperatures recommended on the label.  Clean all areas the individual has used often  Clean all touchable surfaces, such as counters, tabletops, doorknobs, bathroom fixtures, toilets, phones, keyboards, tablets, and bedside tables, every day. Also, clean any surfaces that may have blood, body fluids, and/or secretions or excretions on them.  Wear gloves when cleaning surfaces the patient has come in  contact with.  Use a diluted bleach solution (e.g., dilute bleach with 1 part bleach and 10 parts water) or a household disinfectant with a label that says EPA-registered for coronaviruses. To make a bleach solution at home, add 1 tablespoon of bleach to 1 quart (4 cups) of water. For a larger supply, add  cup of bleach to 1 gallon (16 cups) of water.  Read labels of cleaning products and follow recommendations provided on product labels. Labels contain instructions for safe and effective use of the cleaning product including precautions you should take when applying the product, such as wearing gloves or eye protection and making sure you have good ventilation during use of the product.  Remove gloves and wash hands immediately after cleaning.  Monitor yourself for signs and symptoms of illness Caregivers and household  members are considered close contacts, should monitor their health, and will be asked to limit movement outside of the home to the extent possible. Follow the monitoring steps for close contacts listed on the symptom monitoring form.   ? If you have additional questions, contact your local health department or call the epidemiologist on call at [phone] (available 24/7). ? This guidance is subject to change. For the most up-to-date guidance from CDC, please refer to their website: [employer]Met[employer].hu    Activity:  As tolerated with Full fall precautions use walker/cane & assistance as needed   Discharge Instructions    Call MD for:  difficulty breathing, headache or visual disturbances   Complete by: As directed    Call MD for:  extreme fatigue   Complete by: As directed    Diet - low sodium heart healthy   Complete by: As directed    Discharge instructions   Complete by: As directed    Follow with Primary MD in 1-2 weeks  Please get a complete blood count and chemistry panel checked by your Primary MD at your next visit, and again as instructed by your Primary MD.  Get Medicines reviewed and adjusted: Please take all your medications with you for your next visit with your Primary MD  Laboratory/radiological data: Please request your Primary MD to go over all hospital tests and procedure/radiological results at the follow up, please ask your Primary MD to get all Hospital records sent to his/her office.  In some cases, they will be blood work, cultures and biopsy results pending at the time of your discharge. Please request that your primary care M.D. follows up on these results.  Also Note the following: If you experience worsening of your admission symptoms, develop shortness of breath, life threatening emergency, suicidal or homicidal thoughts you must seek medical attention immediately by calling 911 or calling your  MD immediately  if symptoms less severe.  You must read complete instructions/literature along with all the possible adverse reactions/side effects for all the Medicines you take and that have been prescribed to you. Take any new Medicines after you have completely understood and accpet all the possible adverse reactions/side effects.   Do not drive when taking Pain medications or sleeping medications (Benzodaizepines)  Do not take more than prescribed Pain, Sleep and Anxiety Medications. It is not advisable to combine anxiety,sleep and pain medications without talking with your primary care practitioner  Special Instructions: If you have smoked or chewed Tobacco  in the last 2 yrs please stop smoking, stop any regular Alcohol  and or any Recreational drug use.  Wear Seat belts while driving.  Please note: You were cared for  by a hospitalist during your hospital stay. Once you are discharged, your primary care physician will handle any further medical issues. Please note that NO REFILLS for any discharge medications will be authorized once you are discharged, as it is imperative that you return to your primary care physician (or establish a relationship with a primary care physician if you do not have one) for your post hospital discharge needs so that they can reassess your need for medications and monitor your lab values.   1) 21 days of isolation from the day of your first positive Covid test  2) you have a lung nodule-please ask your primary care practitioner to repeat repeat CT chest in 3 months.  In some cases-these lesions can turn cancerous.  3) you have ascending aortic aneurysm-please ask your primary care practitioner to monitor this aneurysm, if it increases significantly-you may need surgery.  4) stop smoking and alcohol use.  5) if you develop worsening shortness of breath-please seek immediate medical attention.   Increase activity slowly   Complete by: As directed       Allergies as of 04/01/2020      Reactions   Lisinopril Anaphylaxis      Medication List    STOP taking these medications   GOODY HEADACHE PO     TAKE these medications   albuterol 108 (90 Base) MCG/ACT inhaler Commonly known as: VENTOLIN HFA Inhale 2 puffs into the lungs every 2 (two) hours as needed for wheezing or shortness of breath.   benzonatate 100 MG capsule Commonly known as: Tessalon Perles Take 1 capsule (100 mg total) by mouth every 6 (six) hours as needed for cough.   carvedilol 6.25 MG tablet Commonly known as: COREG Take 1 tablet (6.25 mg total) by mouth 2 (two) times daily with a meal.   multivitamin with minerals Tabs tablet Take 1 tablet by mouth daily. Start taking on: April 02, 2020   predniSONE 10 MG tablet Commonly known as: DELTASONE Take 4 tablets daily for 2 days,  Take 3 tablets daily for 2 days, Take 2 tablets  daily for 2 days, Take 1 tablet daily for 1 day-&  stop   thiamine 100 MG tablet Take 1 tablet (100 mg total) by mouth daily. Start taking on: April 02, 2020       Follow-up Information    Primary care MD. Schedule an appointment as soon as possible for a visit in 1 week(s).              Allergies  Allergen Reactions  . Lisinopril Anaphylaxis    Other Procedures/Studies: DG Chest 2 View  Result Date: 03/29/2020 CLINICAL DATA:  Sharp left chest pain on and off for 2 days EXAM: CHEST - 2 VIEW COMPARISON:  04/04/2016 FINDINGS: Artifact from EKG pads. Equivocal indistinct opacity anterior to the left hilum. No edema, effusion, or pneumothorax. Normal heart size. Mild aortic tortuosity. IMPRESSION: Equivocal left perihilar infiltrate. Followup PA and lateral chest X-ray is recommended in 3-4 weeks to ensure resolution. Electronically Signed   By: Marnee Spring M.D.   On: 03/29/2020 07:49   CT Angio Chest/Abd/Pel for Dissection W and/or W/WO  Result Date: 03/29/2020 CLINICAL DATA:  Chest pain, back pain in a 54 year old  male EXAM: CT ANGIOGRAPHY CHEST, ABDOMEN AND PELVIS TECHNIQUE: Non-contrast CT of the chest was initially obtained. Multidetector CT imaging through the chest, abdomen and pelvis was performed using the standard protocol during bolus administration of intravenous contrast. Multiplanar reconstructed images and MIPs  were obtained and reviewed to evaluate the vascular anatomy. CONTRAST:  OMNIPAQUE IOHEXOL 350 MG/ML SOLN COMPARISON:  Prior chest x-rays, no previous cross-sectional imaging of the chest. FINDINGS: CTA CHEST FINDINGS Cardiovascular: 4 cm ascending thoracic aorta. Noncontrast images without signs of periaortic stranding or intramural hematoma. Three-vessel branching pattern in the chest with patent great vessels. Sinuses of Valsalva at 4.3 cm. Aortic root at 4.0 cm. Descending thoracic aorta is nonaneurysmal. Central pulmonary vasculature unremarkable with limited assessment. Heart size mildly enlarged without pericardial effusion. Mediastinum/Nodes: Thoracic inlet structures are normal. No axillary lymphadenopathy. No mediastinal or hilar lymphadenopathy. Esophagus grossly normal. Lungs/Pleura: Patchy areas of ground-glass opacity at the periphery of the RIGHT and LEFT chest. Discrete RIGHT lower lobe pulmonary nodule (image 140, series 7) 8 x 6 mm. Ground-glass nodule in the RIGHT lower lobe at the periphery (image 109, series 7) 7 x 7 mm. Other areas with more geographic ground-glass characteristics, for instance in the RIGHT upper lobe on image 67 of series 7 and in the lingula and LEFT upper lobe on images 81-102 of series 7. Patchy ground-glass scattered throughout the LEFT lower lobe. No dense consolidation or evidence of pleural effusion. Musculoskeletal: See below for full musculoskeletal detail. Review of the MIP images confirms the above findings. CTA ABDOMEN AND PELVIS FINDINGS VASCULAR Aorta: Normal caliber abdominal aorta without aneurysmal dilation in the abdomen. No periaortic  stranding. No wall thickening. Celiac: Patent celiac axis. Hepatic arterial supply derive both from the celiac and SMA with accessory hepatic artery arising from the SMA. SMA: Patent SMA. Accessory RIGHT hepatic artery is derive from the SMA as described above. Renals: Accessory renal arteries bilaterally to the lower pole of the RIGHT and LEFT kidney. Second accessory artery on the LEFT also supplies the lower pole symmetric renal enhancement. IMA: Patent without evidence of aneurysm, dissection, vasculitis or significant stenosis. Inflow: Patent without evidence of aneurysm, dissection, vasculitis or significant stenosis. Veins: Not well evaluated but normal smooth contour of the IVC. Venous phase not obtained. Outflow: Patent into the upper thigh without aneurysmal caliber of the iliac vasculature. Review of the MIP images confirms the above findings. NON-VASCULAR Hepatobiliary: Probable hepatic steatosis. No gross lesion on arterial phase. No pericholecystic stranding. Pancreas: Normal Spleen: Normal Adrenals/Urinary Tract: Normal adrenal glands. Symmetric renal enhancement. No hydronephrosis. Possible RIGHT intrarenal calculus in the lower pole. Areas of excreted contrast however in the collecting systems Stomach/Bowel: Small hiatal hernia. Colonic diverticulosis. No acute gastrointestinal process. Lymphatic: There is no gastrohepatic or hepatoduodenal ligament lymphadenopathy. No retroperitoneal or mesenteric lymphadenopathy. No pelvic sidewall lymphadenopathy. Reproductive: Unremarkable CT appearance of the prostate gland. Other: No ascites. Musculoskeletal: No acute bone finding. No destructive bone process. Spinal degenerative changes. Review of the MIP images confirms the above findings. IMPRESSION: 1. Patchy areas of ground-glass opacity at the periphery of the RIGHT and LEFT chest. Other areas with more geographic ground-glass characteristics. Findings are nonspecific and could be seen with multifocal  pneumonia, perhaps atypical or viral. COVID-19 is considered. 2. Pulmonary nodules in the RIGHT lower lobe measuring up to 8 mm. This 8 mm nodule is solid and without signs of calcification. Could also be related to infection or inflammation though would suggest 3 month follow-up to ensure resolution given that it displays solid, noncalcified appearance and is different from other areas of opacity in the chest. 3. Ascending thoracic aortic aneurysm at 4 cm without signs of dissection. Recommend annual imaging followup by CTA or MRA. This recommendation follows 2010 ACCF/AHA/AATS/ACR/ASA/SCA/SCAI/SIR/STS/SVM Guidelines for  the Diagnosis and Management of Patients with Thoracic Aortic Disease. Circulation. 2010; 121: Z610-R604. Aortic aneurysm NOS (ICD10-I71.9) 4. No evidence of abdominal aortic aneurysm or dissection. 5. Hepatic arterial supply derived both from the celiac and SMA with accessory hepatic artery arising from the SMA. 6. Possible RIGHT intrarenal calculus in the lower pole. 7. Small hiatal hernia. 8. Colonic diverticulosis without evidence of acute gastrointestinal process. 9. Aortic atherosclerosis. These results were called by telephone at the time of interpretation on 03/29/2020 at 12:09 pm to provider Dr. Dalene Seltzer, Who verbally acknowledged these results. Aortic Atherosclerosis (ICD10-I70.0). Electronically Signed   By: Donzetta Kohut M.D.   On: 03/29/2020 12:03     TODAY-DAY OF DISCHARGE:  Subjective:   Chad Salas today has no headache,no chest abdominal pain,no new weakness tingling or numbness, feels much better wants to go home today.   Objective:   Blood pressure (!) 153/116, pulse 79, temperature 98.5 F (36.9 C), temperature source Oral, resp. rate 20, height 5\' 11"  (1.803 m), weight 64.8 kg, SpO2 98 %.  Intake/Output Summary (Last 24 hours) at 04/01/2020 1021 Last data filed at 04/01/2020 0900 Gross per 24 hour  Intake 480 ml  Output --  Net 480 ml   Filed Weights    03/29/20 0714 03/30/20 0312 03/31/20 0559  Weight: 77.1 kg 67.3 kg 64.8 kg    Exam: Awake Alert, Oriented *3, No new F.N deficits, Normal affect .AT,PERRAL Supple Neck,No JVD, No cervical lymphadenopathy appriciated.  Symmetrical Chest wall movement, Good air movement bilaterally, CTAB RRR,No Gallops,Rubs or new Murmurs, No Parasternal Heave +ve B.Sounds, Abd Soft, Non tender, No organomegaly appriciated, No rebound -guarding or rigidity. No Cyanosis, Clubbing or edema, No new Rash or bruise   PERTINENT RADIOLOGIC STUDIES: DG Chest 2 View  Result Date: 03/29/2020 CLINICAL DATA:  Sharp left chest pain on and off for 2 days EXAM: CHEST - 2 VIEW COMPARISON:  04/04/2016 FINDINGS: Artifact from EKG pads. Equivocal indistinct opacity anterior to the left hilum. No edema, effusion, or pneumothorax. Normal heart size. Mild aortic tortuosity. IMPRESSION: Equivocal left perihilar infiltrate. Followup PA and lateral chest X-ray is recommended in 3-4 weeks to ensure resolution. Electronically Signed   By: Marnee Spring M.D.   On: 03/29/2020 07:49   CT Angio Chest/Abd/Pel for Dissection W and/or W/WO  Result Date: 03/29/2020 CLINICAL DATA:  Chest pain, back pain in a 54 year old male EXAM: CT ANGIOGRAPHY CHEST, ABDOMEN AND PELVIS TECHNIQUE: Non-contrast CT of the chest was initially obtained. Multidetector CT imaging through the chest, abdomen and pelvis was performed using the standard protocol during bolus administration of intravenous contrast. Multiplanar reconstructed images and MIPs were obtained and reviewed to evaluate the vascular anatomy. CONTRAST:  OMNIPAQUE IOHEXOL 350 MG/ML SOLN COMPARISON:  Prior chest x-rays, no previous cross-sectional imaging of the chest. FINDINGS: CTA CHEST FINDINGS Cardiovascular: 4 cm ascending thoracic aorta. Noncontrast images without signs of periaortic stranding or intramural hematoma. Three-vessel branching pattern in the chest with patent great vessels.  Sinuses of Valsalva at 4.3 cm. Aortic root at 4.0 cm. Descending thoracic aorta is nonaneurysmal. Central pulmonary vasculature unremarkable with limited assessment. Heart size mildly enlarged without pericardial effusion. Mediastinum/Nodes: Thoracic inlet structures are normal. No axillary lymphadenopathy. No mediastinal or hilar lymphadenopathy. Esophagus grossly normal. Lungs/Pleura: Patchy areas of ground-glass opacity at the periphery of the RIGHT and LEFT chest. Discrete RIGHT lower lobe pulmonary nodule (image 140, series 7) 8 x 6 mm. Ground-glass nodule in the RIGHT lower lobe at the periphery (image 109,  series 7) 7 x 7 mm. Other areas with more geographic ground-glass characteristics, for instance in the RIGHT upper lobe on image 67 of series 7 and in the lingula and LEFT upper lobe on images 81-102 of series 7. Patchy ground-glass scattered throughout the LEFT lower lobe. No dense consolidation or evidence of pleural effusion. Musculoskeletal: See below for full musculoskeletal detail. Review of the MIP images confirms the above findings. CTA ABDOMEN AND PELVIS FINDINGS VASCULAR Aorta: Normal caliber abdominal aorta without aneurysmal dilation in the abdomen. No periaortic stranding. No wall thickening. Celiac: Patent celiac axis. Hepatic arterial supply derive both from the celiac and SMA with accessory hepatic artery arising from the SMA. SMA: Patent SMA. Accessory RIGHT hepatic artery is derive from the SMA as described above. Renals: Accessory renal arteries bilaterally to the lower pole of the RIGHT and LEFT kidney. Second accessory artery on the LEFT also supplies the lower pole symmetric renal enhancement. IMA: Patent without evidence of aneurysm, dissection, vasculitis or significant stenosis. Inflow: Patent without evidence of aneurysm, dissection, vasculitis or significant stenosis. Veins: Not well evaluated but normal smooth contour of the IVC. Venous phase not obtained. Outflow: Patent into  the upper thigh without aneurysmal caliber of the iliac vasculature. Review of the MIP images confirms the above findings. NON-VASCULAR Hepatobiliary: Probable hepatic steatosis. No gross lesion on arterial phase. No pericholecystic stranding. Pancreas: Normal Spleen: Normal Adrenals/Urinary Tract: Normal adrenal glands. Symmetric renal enhancement. No hydronephrosis. Possible RIGHT intrarenal calculus in the lower pole. Areas of excreted contrast however in the collecting systems Stomach/Bowel: Small hiatal hernia. Colonic diverticulosis. No acute gastrointestinal process. Lymphatic: There is no gastrohepatic or hepatoduodenal ligament lymphadenopathy. No retroperitoneal or mesenteric lymphadenopathy. No pelvic sidewall lymphadenopathy. Reproductive: Unremarkable CT appearance of the prostate gland. Other: No ascites. Musculoskeletal: No acute bone finding. No destructive bone process. Spinal degenerative changes. Review of the MIP images confirms the above findings. IMPRESSION: 1. Patchy areas of ground-glass opacity at the periphery of the RIGHT and LEFT chest. Other areas with more geographic ground-glass characteristics. Findings are nonspecific and could be seen with multifocal pneumonia, perhaps atypical or viral. COVID-19 is considered. 2. Pulmonary nodules in the RIGHT lower lobe measuring up to 8 mm. This 8 mm nodule is solid and without signs of calcification. Could also be related to infection or inflammation though would suggest 3 month follow-up to ensure resolution given that it displays solid, noncalcified appearance and is different from other areas of opacity in the chest. 3. Ascending thoracic aortic aneurysm at 4 cm without signs of dissection. Recommend annual imaging followup by CTA or MRA. This recommendation follows 2010 ACCF/AHA/AATS/ACR/ASA/SCA/SCAI/SIR/STS/SVM Guidelines for the Diagnosis and Management of Patients with Thoracic Aortic Disease. Circulation. 2010; 121: W119-J478. Aortic  aneurysm NOS (ICD10-I71.9) 4. No evidence of abdominal aortic aneurysm or dissection. 5. Hepatic arterial supply derived both from the celiac and SMA with accessory hepatic artery arising from the SMA. 6. Possible RIGHT intrarenal calculus in the lower pole. 7. Small hiatal hernia. 8. Colonic diverticulosis without evidence of acute gastrointestinal process. 9. Aortic atherosclerosis. These results were called by telephone at the time of interpretation on 03/29/2020 at 12:09 pm to provider Dr. Dalene Seltzer, Who verbally acknowledged these results. Aortic Atherosclerosis (ICD10-I70.0). Electronically Signed   By: Donzetta Kohut M.D.   On: 03/29/2020 12:03     PERTINENT LAB RESULTS: CBC: Recent Labs    03/31/20 0129 04/01/20 0119  WBC 1.5* 2.8*  HGB 13.6 13.3  HCT 39.1 37.9*  PLT 124* 129*  CMET CMP     Component Value Date/Time   NA 134 (L) 04/01/2020 0119   K 4.0 04/01/2020 0119   CL 97 (L) 04/01/2020 0119   CO2 24 04/01/2020 0119   GLUCOSE 147 (H) 04/01/2020 0119   BUN 11 04/01/2020 0119   CREATININE 0.85 04/01/2020 0119   CALCIUM 9.1 04/01/2020 0119   PROT 6.8 04/01/2020 0119   ALBUMIN 2.9 (L) 04/01/2020 0119   AST 297 (H) 04/01/2020 0119   ALT 119 (H) 04/01/2020 0119   ALKPHOS 242 (H) 04/01/2020 0119   BILITOT 1.8 (H) 04/01/2020 0119   GFRNONAA >60 04/01/2020 0119   GFRAA >60 02/22/2016 1118    GFR Estimated Creatinine Clearance: 92.1 mL/min (by C-G formula based on SCr of 0.85 mg/dL). No results for input(s): LIPASE, AMYLASE in the last 72 hours. No results for input(s): CKTOTAL, CKMB, CKMBINDEX, TROPONINI in the last 72 hours. Invalid input(s): POCBNP Recent Labs    03/31/20 0129 04/01/20 0119  DDIMER 0.60* 0.53*   No results for input(s): HGBA1C in the last 72 hours. Recent Labs    03/29/20 1433  TRIG 90   No results for input(s): TSH, T4TOTAL, T3FREE, THYROIDAB in the last 72 hours.  Invalid input(s): FREET3 Recent Labs    03/31/20 0129 04/01/20 0119   FERRITIN 674* 610*   Coags: No results for input(s): INR in the last 72 hours.  Invalid input(s): PT Microbiology: Recent Results (from the past 240 hour(s))  Resp Panel by RT-PCR (Flu A&B, Covid) Nasopharyngeal Swab     Status: Abnormal   Collection Time: 03/29/20  9:35 AM   Specimen: Nasopharyngeal Swab; Nasopharyngeal(NP) swabs in vial transport medium  Result Value Ref Range Status   SARS Coronavirus 2 by RT PCR POSITIVE (A) NEGATIVE Final    Comment: emailed L. Berdik RN 11:45 03/29/20 ([email or web address]) (NOTE) SARS-CoV-2 target nucleic acids are DETECTED.  The SARS-CoV-2 RNA is generally detectable in upper respiratory specimens during the acute phase of infection. Positive results are indicative of the presence of the identified virus, but do not rule out bacterial infection or co-infection with other pathogens not detected by the test. Clinical correlation with patient history and other diagnostic information is necessary to determine patient infection status. The expected result is Negative.  Fact Sheet for Patients: BloggerCourse.com  Fact Sheet for Healthcare Providers: SeriousBroker.it  This test is not yet approved or cleared by the [place] FDA and  has been authorized for detection and/or diagnosis of SARS-CoV-2 by FDA under an Emergency Use Authorization (EUA).  This EUA will remain in effect (meaning this test can be used) for the duration of  the COVID- 19 declaration under Section 564(b)(1) of the Act, 21 U.S.C. section 360bbb-3(b)(1), unless the authorization is terminated or revoked sooner.     Influenza A by PCR NEGATIVE NEGATIVE Final   Influenza B by PCR NEGATIVE NEGATIVE Final    Comment: (NOTE) The Xpert Xpress SARS-CoV-2/FLU/RSV plus assay is intended as an aid in the diagnosis of influenza from Nasopharyngeal swab specimens and should not be used as a sole basis for treatment. Nasal washings  and aspirates are unacceptable for Xpert Xpress SARS-CoV-2/FLU/RSV testing.  Fact Sheet for Patients: BloggerCourse.com  Fact Sheet for Healthcare Providers: SeriousBroker.it  This test is not yet approved or cleared by the [place] FDA and has been authorized for detection and/or diagnosis of SARS-CoV-2 by FDA under an Emergency Use Authorization (EUA). This EUA will remain in effect (meaning this test can be used) for  the duration of the COVID-19 declaration under Section 564(b)(1) of the Act, 21 U.S.C. section 360bbb-3(b)(1), unless the authorization is terminated or revoked.  Performed at [facility], [address] [place], [place] [address]   Blood Culture (routine x 2)     Status: None (Preliminary result)   Collection Time: 03/29/20  2:50 PM   Specimen: BLOOD LEFT ARM  Result Value Ref Range Status   Specimen Description BLOOD LEFT ARM  Final   Special Requests   Final    BOTTLES DRAWN AEROBIC AND ANAEROBIC Blood Culture adequate volume   Culture   Final    NO GROWTH 3 DAYS Performed at [facility], [address] [place], [place] [address]    Report Status PENDING  Incomplete  Blood Culture (routine x 2)     Status: None (Preliminary result)   Collection Time: 03/29/20  5:00 PM   Specimen: BLOOD RIGHT HAND  Result Value Ref Range Status   Specimen Description BLOOD RIGHT HAND  Final   Special Requests   Final    BOTTLES DRAWN AEROBIC AND ANAEROBIC Blood Culture adequate volume   Culture   Final    NO GROWTH 3 DAYS Performed at [facility], [address] [place], [place] [address]    Report Status PENDING  Incomplete    FURTHER DISCHARGE INSTRUCTIONS:  Get Medicines reviewed and adjusted: Please take all your medications with you for your next visit with your Primary MD  Laboratory/radiological data: Please request your Primary MD to go over all hospital tests and  procedure/radiological results at the follow up, please ask your Primary MD to get all Hospital records sent to his/her office.  In some cases, they will be blood work, cultures and biopsy results pending at the time of your discharge. Please request that your primary care M.D. goes through all the records of your hospital data and follows up on these results.  Also Note the following: If you experience worsening of your admission symptoms, develop shortness of breath, life threatening emergency, suicidal or homicidal thoughts you must seek medical attention immediately by calling 911 or calling your MD immediately  if symptoms less severe.  You must read complete instructions/literature along with all the possible adverse reactions/side effects for all the Medicines you take and that have been prescribed to you. Take any new Medicines after you have completely understood and accpet all the possible adverse reactions/side effects.   Do not drive when taking Pain medications or sleeping medications (Benzodaizepines)  Do not take more than prescribed Pain, Sleep and Anxiety Medications. It is not advisable to combine anxiety,sleep and pain medications without talking with your primary care practitioner  Special Instructions: If you have smoked or chewed Tobacco  in the last 2 yrs please stop smoking, stop any regular Alcohol  and or any Recreational drug use.  Wear Seat belts while driving.  Please note: You were cared for by a hospitalist during your hospital stay. Once you are discharged, your primary care physician will handle any further medical issues. Please note that NO REFILLS for any discharge medications will be authorized once you are discharged, as it is imperative that you return to your primary care physician (or establish a relationship with a primary care physician if you do not have one) for your post hospital discharge needs so that they can reassess your need for medications and  monitor your lab values.  Total Time spent coordinating discharge including counseling,  education and face to face time equals 35 minutes.  SignedJeoffrey Massed 04/01/2020 10:21 AM

## 2020-04-02 ENCOUNTER — Ambulatory Visit (HOSPITAL_COMMUNITY)
Admit: 2020-04-02 | Discharge: 2020-04-02 | Disposition: A | Discharge status: Home / Self Care | Payer: Medicaid Other | Attending: Pulmonary Disease | Admitting: Pulmonary Disease

## 2020-04-02 DIAGNOSIS — U071 COVID-19: Secondary | ICD-10-CM | POA: Insufficient documentation

## 2020-04-02 DIAGNOSIS — J1289 Other viral pneumonia: Secondary | ICD-10-CM | POA: Insufficient documentation

## 2020-04-02 MED ORDER — DIPHENHYDRAMINE HCL 50 MG/ML IJ SOLN: 50.0000 mg | Freq: Once | INTRAMUSCULAR | Status: DC | PRN

## 2020-04-02 MED ORDER — EPINEPHRINE 0.3 MG/0.3ML IJ SOAJ: 0.3000 mg | Freq: Once | INTRAMUSCULAR | Status: DC | PRN

## 2020-04-02 MED ORDER — SODIUM CHLORIDE 0.9 % IV SOLN: INTRAVENOUS | Status: DC | PRN

## 2020-04-02 MED ORDER — ACETAMINOPHEN 325 MG PO TABS
650.0000 mg | ORAL_TABLET | Freq: Four times a day (QID) | ORAL | Status: DC | PRN
  Administered 2020-04-02: 650 mg via ORAL
  Filled 2020-04-02: qty 2

## 2020-04-02 MED ORDER — ALBUTEROL SULFATE HFA 108 (90 BASE) MCG/ACT IN AERS: 2.0000 | INHALATION_SPRAY | Freq: Once | RESPIRATORY_TRACT | Status: DC | PRN

## 2020-04-02 MED ORDER — METHYLPREDNISOLONE SODIUM SUCC 125 MG IJ SOLR: 125.0000 mg | Freq: Once | INTRAMUSCULAR | Status: DC | PRN

## 2020-04-02 MED ORDER — FAMOTIDINE IN NACL 20-0.9 MG/50ML-% IV SOLN: 20.0000 mg | Freq: Once | INTRAVENOUS | Status: DC | PRN

## 2020-04-02 MED ORDER — SODIUM CHLORIDE 0.9 % IV SOLN
100.0000 mg | Freq: Once | INTRAVENOUS | Status: AC
  Administered 2020-04-02: 100 mg via INTRAVENOUS

## 2020-04-02 NOTE — Progress Notes (Signed)
  Diagnosis: COVID-19  Physician: Dr. Delford Field   Procedure: [facility] Med: remdesivir infusion - Provided patient with remdesivir fact sheet for patients, parents and caregivers prior to infusion.  Complications: No immediate complications noted.  Discharge: Discharged home   Edmon Crape 04/02/2020

## 2020-04-02 NOTE — Discharge Instructions (Signed)
If you have any questions or concerns after the infusion please call the Advanced Practice Provider on call at [phone]. This number is ONLY intended for your use regarding questions or concerns about the infusion post-treatment side-effects.  Please do not provide this number to others for use. For return to work notes please contact your primary care provider.   If someone you know is interested in receiving treatment please have them call the COVID hotline at [phone].    

## 2020-04-03 LAB — CULTURE, BLOOD (ROUTINE X 2)
Culture: NO GROWTH
Culture: NO GROWTH
Special Requests: ADEQUATE
Special Requests: ADEQUATE

## 2020-05-01 DIAGNOSIS — Z419 Encounter for procedure for purposes other than remedying health state, unspecified: Secondary | ICD-10-CM | POA: Diagnosis not present

## 2020-06-01 DIAGNOSIS — Z419 Encounter for procedure for purposes other than remedying health state, unspecified: Secondary | ICD-10-CM | POA: Diagnosis not present

## 2020-06-29 DIAGNOSIS — Z419 Encounter for procedure for purposes other than remedying health state, unspecified: Secondary | ICD-10-CM | POA: Diagnosis not present

## 2020-07-30 DIAGNOSIS — Z419 Encounter for procedure for purposes other than remedying health state, unspecified: Secondary | ICD-10-CM | POA: Diagnosis not present

## 2020-08-29 DIAGNOSIS — Z419 Encounter for procedure for purposes other than remedying health state, unspecified: Secondary | ICD-10-CM | POA: Diagnosis not present

## 2020-09-29 DIAGNOSIS — Z419 Encounter for procedure for purposes other than remedying health state, unspecified: Secondary | ICD-10-CM | POA: Diagnosis not present

## 2020-10-29 DIAGNOSIS — Z419 Encounter for procedure for purposes other than remedying health state, unspecified: Secondary | ICD-10-CM | POA: Diagnosis not present

## 2020-11-29 DIAGNOSIS — Z419 Encounter for procedure for purposes other than remedying health state, unspecified: Secondary | ICD-10-CM | POA: Diagnosis not present

## 2020-12-30 DIAGNOSIS — Z419 Encounter for procedure for purposes other than remedying health state, unspecified: Secondary | ICD-10-CM | POA: Diagnosis not present

## 2021-01-29 DIAGNOSIS — Z419 Encounter for procedure for purposes other than remedying health state, unspecified: Secondary | ICD-10-CM | POA: Diagnosis not present

## 2021-01-31 IMAGING — CT CT ANGIO CHEST-ABD-PELV FOR DISSECTION W/ AND WO/W CM
2 of 7 series · 12 of 46 positions shown, 14 images · non-contrast
Comparison: Prior chest x-rays, no previous cross-sectional imaging
of the chest.

CLINICAL DATA: Chest pain, back pain in a 54-year-old male

EXAM:
CT ANGIOGRAPHY CHEST, ABDOMEN AND PELVIS
TECHNIQUE: Non-contrast CT of the chest was initially obtained.

[Series 5: arterial · axial · arterial · 0.67mm/px · z∈[+790,+1378]mm · 9 of 332 slices shown, 11 images]
[im 19/332  soft-tissue]
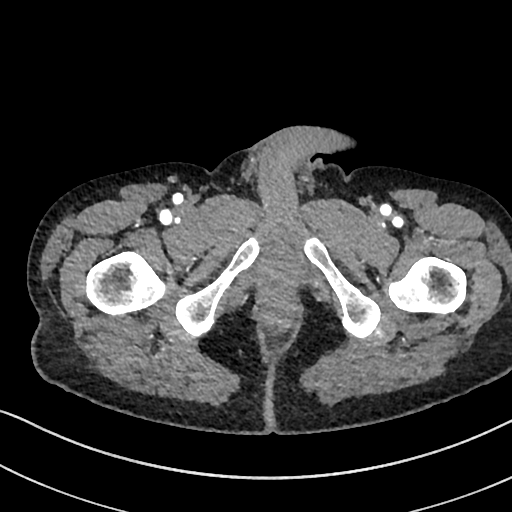
[im 19/332  bone]
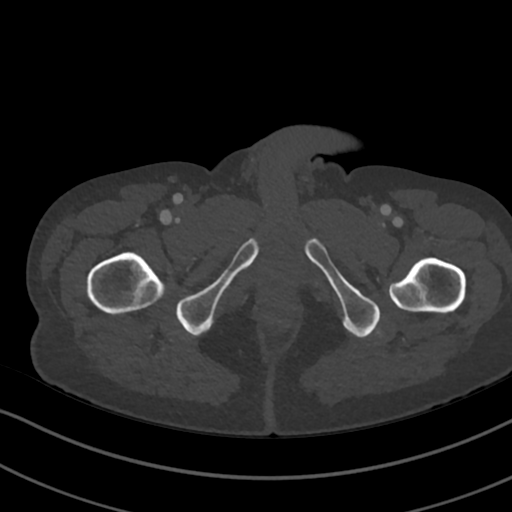
[im 56/332  soft-tissue]
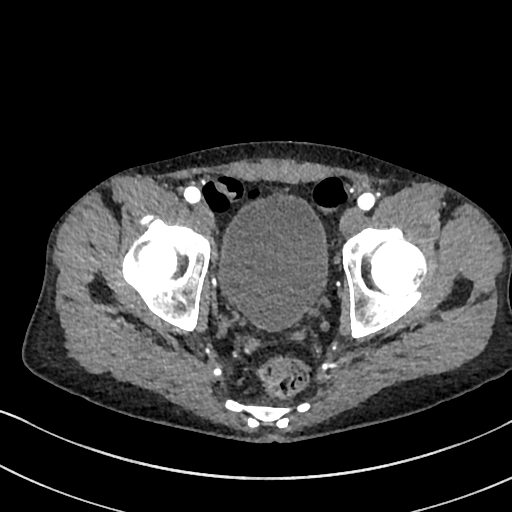
[im 92/332  soft-tissue]
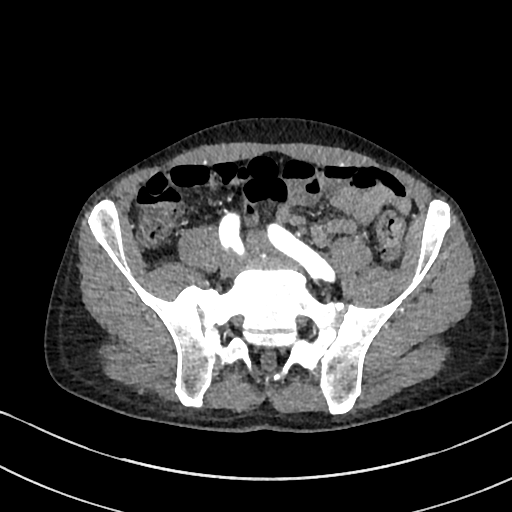
[im 129/332  soft-tissue]
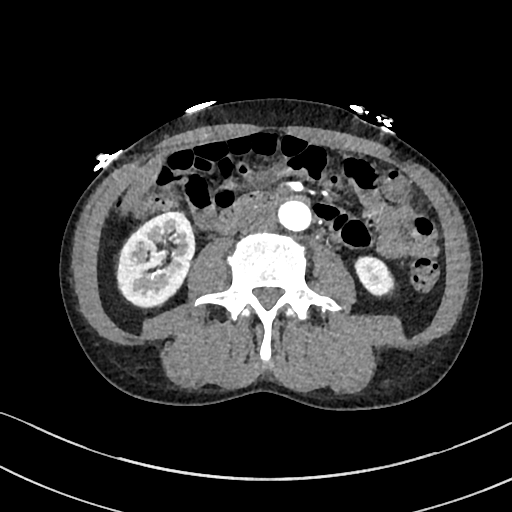
[im 166/332  soft-tissue]
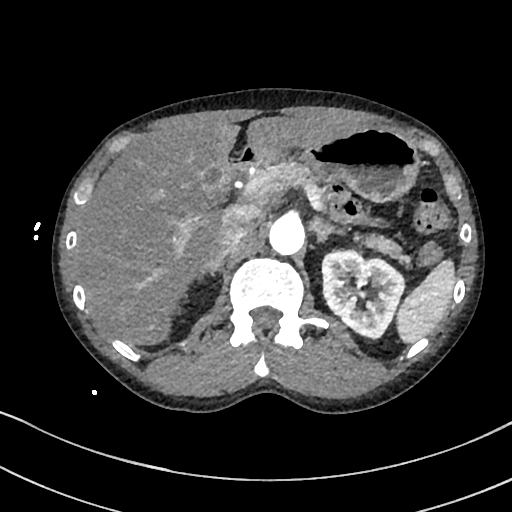
[im 203/332  soft-tissue]
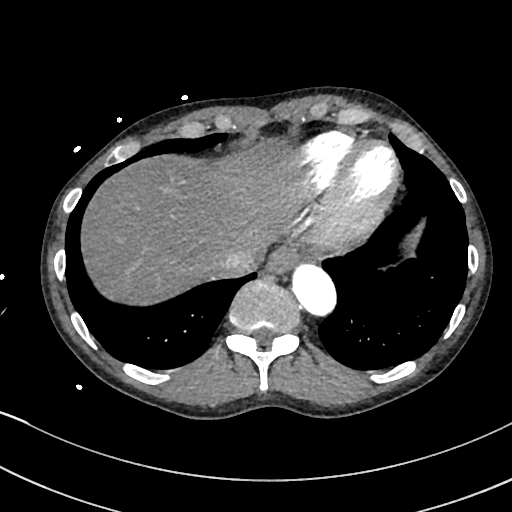
[im 240/332  soft-tissue]
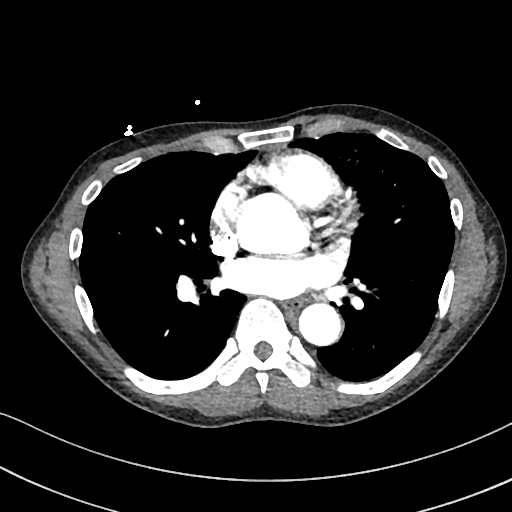
[im 276/332  soft-tissue]
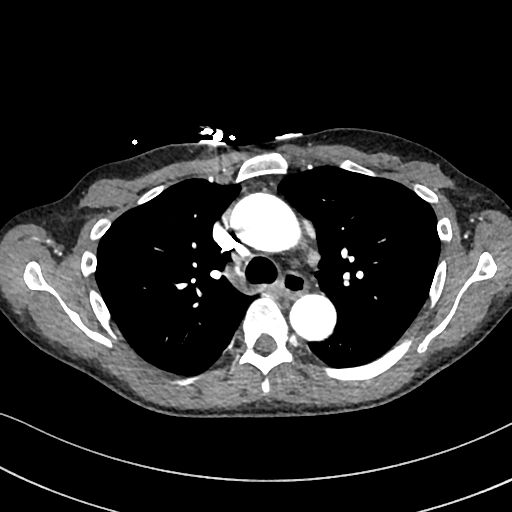
[im 313/332  soft-tissue]
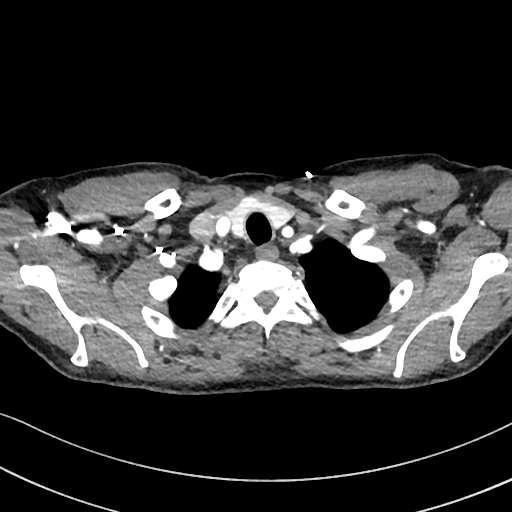
[im 313/332  bone]
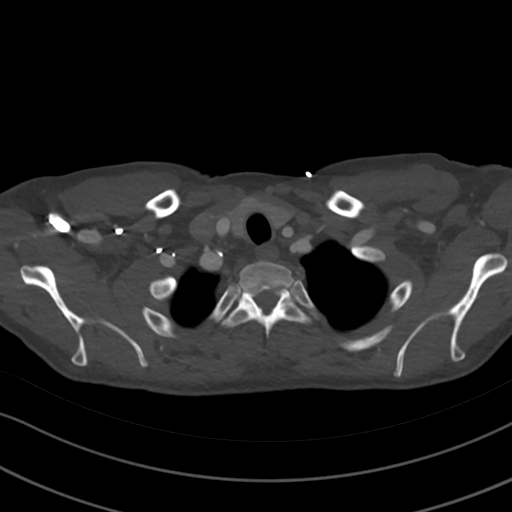

[Series 9: cor · coronal · 0.77mm/px · 3 of 116 slices shown]
[im 29/116  soft-tissue]
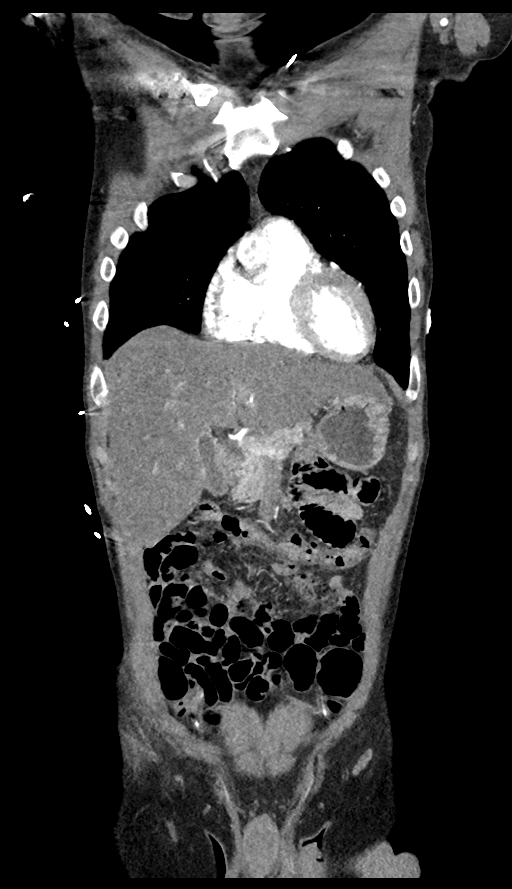
[im 58/116  soft-tissue]
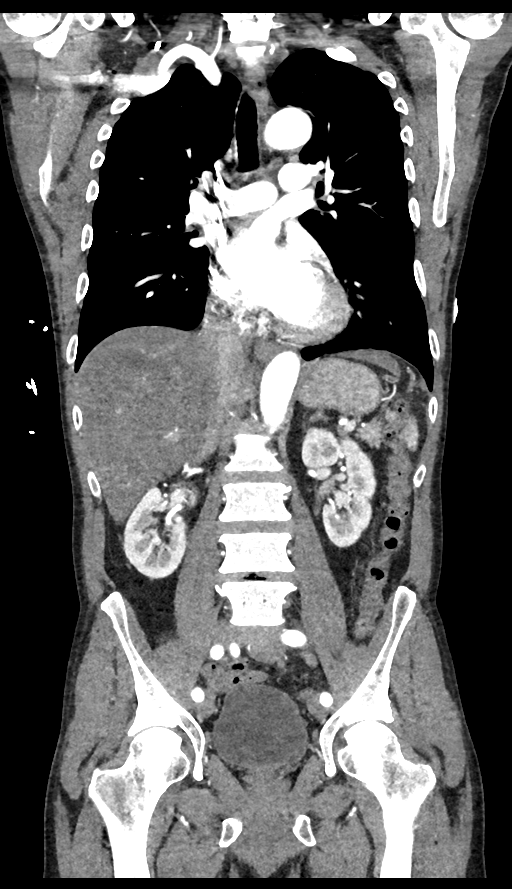
[im 87/116  soft-tissue]
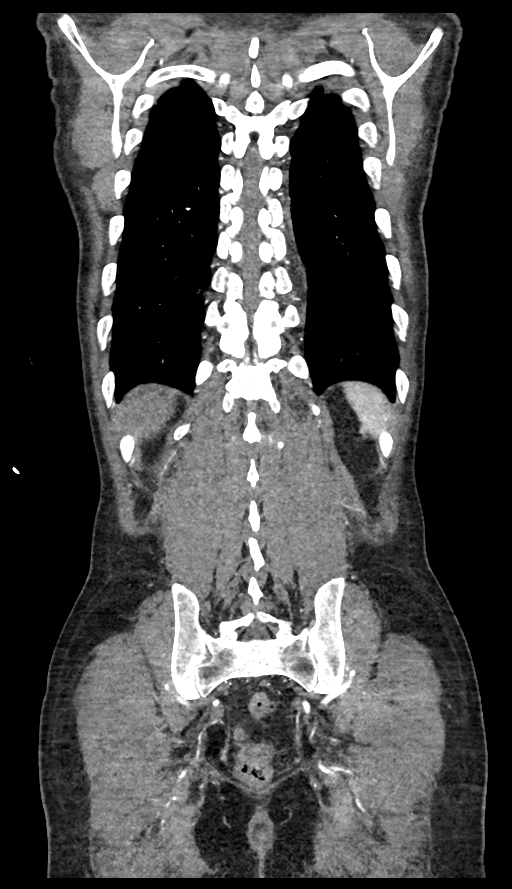

[12 of 46 positions shown; findings below may reference images not displayed]

Multidetector CT imaging through the chest, abdomen and pelvis was
performed using the standard protocol during bolus administration of
intravenous contrast. Multiplanar reconstructed images and MIPs were
obtained and reviewed to evaluate the vascular anatomy.

CONTRAST:  100mL OMNIPAQUE IOHEXOL 350 MG/ML SOLN
FINDINGS: CTA CHEST FINDINGS

Cardiovascular: 4 cm ascending thoracic aorta. Noncontrast images
without signs of periaortic stranding or intramural hematoma.
Three-vessel branching pattern in the chest with patent great
vessels.

Sinuses of Valsalva at 4.3 cm. Aortic root at 4.0 cm. Descending
thoracic aorta is nonaneurysmal.

Central pulmonary vasculature unremarkable with limited assessment.
Heart size mildly enlarged without pericardial effusion.

Mediastinum/Nodes: Thoracic inlet structures are normal. No axillary
lymphadenopathy. No mediastinal or hilar lymphadenopathy. Esophagus
grossly normal.

Lungs/Pleura: Patchy areas of ground-glass opacity at the periphery
of the RIGHT and LEFT chest. Discrete RIGHT lower lobe pulmonary
nodule (image 140, series [DATE] x 6 mm.

Ground-glass nodule in the RIGHT lower lobe at the periphery (image
109, series [DATE] x 7 mm. Other areas with more geographic
ground-glass characteristics, for instance in the RIGHT upper lobe
on image 67 of series 7 and in the lingula and LEFT upper lobe on
images 81-102 of series 7. Patchy ground-glass scattered throughout
the LEFT lower lobe. No dense consolidation or evidence of pleural
effusion.

Musculoskeletal: See below for full musculoskeletal detail.

Review of the MIP images confirms the above findings.

CTA ABDOMEN AND PELVIS FINDINGS

VASCULAR

Aorta: Normal caliber abdominal aorta without aneurysmal dilation in
the abdomen. No periaortic stranding. No wall thickening.

Celiac: Patent celiac axis. Hepatic arterial supply derive both from
the celiac and SMA with accessory hepatic artery arising from the
SMA.

SMA: Patent SMA. Accessory RIGHT hepatic artery is derive from the
SMA as described above.

Renals: Accessory renal arteries bilaterally to the lower pole of
the RIGHT and LEFT kidney. Second accessory artery on the LEFT also
supplies the lower pole symmetric renal enhancement.

IMA: Patent without evidence of aneurysm, dissection, vasculitis or
significant stenosis.

Inflow: Patent without evidence of aneurysm, dissection, vasculitis
or significant stenosis.

Veins: Not well evaluated but normal smooth contour of the IVC.
Venous phase not obtained.

Outflow: Patent into the upper thigh without aneurysmal caliber of
the iliac vasculature.

Review of the MIP images confirms the above findings.

NON-VASCULAR

Hepatobiliary: Probable hepatic steatosis. No gross lesion on
arterial phase. No pericholecystic stranding.

Pancreas: Normal

Spleen: Normal

Adrenals/Urinary Tract: Normal adrenal glands. Symmetric renal
enhancement. No hydronephrosis. Possible RIGHT intrarenal calculus
in the lower pole. Areas of excreted contrast however in the
collecting systems

Stomach/Bowel: Small hiatal hernia. Colonic diverticulosis. No acute
gastrointestinal process.

Lymphatic: There is no gastrohepatic or hepatoduodenal ligament
lymphadenopathy. No retroperitoneal or mesenteric lymphadenopathy.

No pelvic sidewall lymphadenopathy.

Reproductive: Unremarkable CT appearance of the prostate gland.

Other: No ascites.

Musculoskeletal: No acute bone finding. No destructive bone process.
Spinal degenerative changes.

Review of the MIP images confirms the above findings.
IMPRESSION: 1. Patchy areas of ground-glass opacity at the periphery of the
RIGHT and LEFT chest. Other areas with more geographic ground-glass
characteristics. Findings are nonspecific and could be seen with
multifocal pneumonia, perhaps atypical or viral. [id number] is
considered.
2. Pulmonary nodules in the RIGHT lower lobe measuring up to 8 mm.
This 8 mm nodule is solid and without signs of calcification. Could
also be related to infection or inflammation though would suggest 3
month follow-up to ensure resolution given that it displays solid,
noncalcified appearance and is different from other areas of opacity
in the chest.
3. Ascending thoracic aortic aneurysm at 4 cm without signs of
dissection. Recommend annual imaging followup by CTA or MRA. This
recommendation follows 0040
ACCF/AHA/AATS/ACR/ASA/SCA/HENOLD/LLEWELLYN/RABELO/BONDRE Guidelines for the
Diagnosis and Management of Patients with Thoracic Aortic Disease.
Circulation. 0040; 121: E266-e369. Aortic aneurysm NOS (VEBEX-JBS.8)
4. No evidence of abdominal aortic aneurysm or dissection.
5. Hepatic arterial supply derived both from the celiac and SMA with
accessory hepatic artery arising from the SMA.
6. Possible RIGHT intrarenal calculus in the lower pole.
7. Small hiatal hernia.
8. Colonic diverticulosis without evidence of acute gastrointestinal
process.
9. Aortic atherosclerosis.

These results were called by telephone at the time of interpretation
on 03/29/2020 at [DATE] to provider Dr. Dadi, Who verbally
acknowledged these results.

Aortic Atherosclerosis (VEBEX-PAZ.Z).

## 2021-03-01 DIAGNOSIS — Z419 Encounter for procedure for purposes other than remedying health state, unspecified: Secondary | ICD-10-CM | POA: Diagnosis not present

## 2021-03-31 DIAGNOSIS — Z419 Encounter for procedure for purposes other than remedying health state, unspecified: Secondary | ICD-10-CM | POA: Diagnosis not present

## 2021-05-01 DIAGNOSIS — Z419 Encounter for procedure for purposes other than remedying health state, unspecified: Secondary | ICD-10-CM | POA: Diagnosis not present

## 2021-06-01 DIAGNOSIS — Z419 Encounter for procedure for purposes other than remedying health state, unspecified: Secondary | ICD-10-CM | POA: Diagnosis not present

## 2021-06-29 DIAGNOSIS — Z419 Encounter for procedure for purposes other than remedying health state, unspecified: Secondary | ICD-10-CM | POA: Diagnosis not present

## 2021-07-30 DIAGNOSIS — Z419 Encounter for procedure for purposes other than remedying health state, unspecified: Secondary | ICD-10-CM | POA: Diagnosis not present

## 2021-08-29 DIAGNOSIS — Z419 Encounter for procedure for purposes other than remedying health state, unspecified: Secondary | ICD-10-CM | POA: Diagnosis not present

## 2021-09-29 DIAGNOSIS — Z419 Encounter for procedure for purposes other than remedying health state, unspecified: Secondary | ICD-10-CM | POA: Diagnosis not present

## 2021-10-29 DIAGNOSIS — Z419 Encounter for procedure for purposes other than remedying health state, unspecified: Secondary | ICD-10-CM | POA: Diagnosis not present

## 2021-11-29 DIAGNOSIS — Z419 Encounter for procedure for purposes other than remedying health state, unspecified: Secondary | ICD-10-CM | POA: Diagnosis not present

## 2021-12-30 DIAGNOSIS — Z419 Encounter for procedure for purposes other than remedying health state, unspecified: Secondary | ICD-10-CM | POA: Diagnosis not present

## 2022-01-29 DIAGNOSIS — Z419 Encounter for procedure for purposes other than remedying health state, unspecified: Secondary | ICD-10-CM | POA: Diagnosis not present

## 2022-03-01 DIAGNOSIS — Z419 Encounter for procedure for purposes other than remedying health state, unspecified: Secondary | ICD-10-CM | POA: Diagnosis not present

## 2022-03-31 DIAGNOSIS — Z419 Encounter for procedure for purposes other than remedying health state, unspecified: Secondary | ICD-10-CM | POA: Diagnosis not present

## 2022-05-01 DIAGNOSIS — Z419 Encounter for procedure for purposes other than remedying health state, unspecified: Secondary | ICD-10-CM | POA: Diagnosis not present

## 2022-06-02 ENCOUNTER — Other Ambulatory Visit: Payer: Self-pay

## 2022-06-02 ENCOUNTER — Emergency Department (HOSPITAL_COMMUNITY): Payer: Medicaid Other

## 2022-06-02 ENCOUNTER — Encounter (HOSPITAL_COMMUNITY): Payer: Self-pay

## 2022-06-02 ENCOUNTER — Emergency Department (HOSPITAL_COMMUNITY)
Admission: EM | Admit: 2022-06-02 | Discharge: 2022-06-02 | Disposition: A | Discharge status: Home / Self Care | Payer: Medicaid Other | Attending: Emergency Medicine | Admitting: Emergency Medicine

## 2022-06-02 DIAGNOSIS — R0789 Other chest pain: Secondary | ICD-10-CM | POA: Diagnosis not present

## 2022-06-02 DIAGNOSIS — F17210 Nicotine dependence, cigarettes, uncomplicated: Secondary | ICD-10-CM | POA: Insufficient documentation

## 2022-06-02 DIAGNOSIS — J441 Chronic obstructive pulmonary disease with (acute) exacerbation: Secondary | ICD-10-CM | POA: Diagnosis not present

## 2022-06-02 DIAGNOSIS — J069 Acute upper respiratory infection, unspecified: Secondary | ICD-10-CM | POA: Diagnosis not present

## 2022-06-02 DIAGNOSIS — Z7951 Long term (current) use of inhaled steroids: Secondary | ICD-10-CM | POA: Diagnosis not present

## 2022-06-02 DIAGNOSIS — Z20822 Contact with and (suspected) exposure to covid-19: Secondary | ICD-10-CM | POA: Insufficient documentation

## 2022-06-02 DIAGNOSIS — R079 Chest pain, unspecified: Secondary | ICD-10-CM | POA: Diagnosis not present

## 2022-06-02 DIAGNOSIS — I10 Essential (primary) hypertension: Secondary | ICD-10-CM | POA: Insufficient documentation

## 2022-06-02 DIAGNOSIS — Z79899 Other long term (current) drug therapy: Secondary | ICD-10-CM | POA: Diagnosis not present

## 2022-06-02 DIAGNOSIS — R1013 Epigastric pain: Secondary | ICD-10-CM | POA: Insufficient documentation

## 2022-06-02 DIAGNOSIS — R0982 Postnasal drip: Secondary | ICD-10-CM

## 2022-06-02 LAB — CBC WITH DIFFERENTIAL/PLATELET
Abs Immature Granulocytes: 0.01 10*3/uL (ref 0.00–0.07)
Basophils Absolute: 0 10*3/uL (ref 0.0–0.1)
Basophils Relative: 1 %
Eosinophils Absolute: 0 10*3/uL (ref 0.0–0.5)
Eosinophils Relative: 0 %
HCT: 43.8 % (ref 39.0–52.0)
Hemoglobin: 15.3 g/dL (ref 13.0–17.0)
Immature Granulocytes: 0 %
Lymphocytes Relative: 18 %
Lymphs Abs: 0.8 10*3/uL (ref 0.7–4.0)
MCH: 34.2 pg — ABNORMAL HIGH (ref 26.0–34.0)
MCHC: 34.9 g/dL (ref 30.0–36.0)
MCV: 97.8 fL (ref 80.0–100.0)
Monocytes Absolute: 0.4 10*3/uL (ref 0.1–1.0)
Monocytes Relative: 10 %
Neutro Abs: 3.2 10*3/uL (ref 1.7–7.7)
Neutrophils Relative %: 71 %
Platelets: 210 10*3/uL (ref 150–400)
RBC: 4.48 MIL/uL (ref 4.22–5.81)
RDW: 12.9 % (ref 11.5–15.5)
WBC: 4.4 10*3/uL (ref 4.0–10.5)
nRBC: 0 % (ref 0.0–0.2)

## 2022-06-02 LAB — TROPONIN I (HIGH SENSITIVITY)
Troponin I (High Sensitivity): 7 ng/L (ref <18)
Troponin I (High Sensitivity): 8 ng/L (ref <18)

## 2022-06-02 LAB — COMPREHENSIVE METABOLIC PANEL
ALT: 30 U/L (ref 0–44)
AST: 60 U/L — ABNORMAL HIGH (ref 15–41)
Albumin: 3.9 g/dL (ref 3.5–5.0)
Alkaline Phosphatase: 104 U/L (ref 38–126)
Anion gap: 12 (ref 5–15)
BUN: <5 mg/dL — ABNORMAL LOW (ref 6–20)
CO2: 20 mmol/L — ABNORMAL LOW (ref 22–32)
Calcium: 9.4 mg/dL (ref 8.9–10.3)
Chloride: 101 mmol/L (ref 98–111)
Creatinine, Ser: 0.93 mg/dL (ref 0.61–1.24)
GFR, Estimated: >60 mL/min (ref >60)
Glucose, Bld: 99 mg/dL (ref 70–99)
Potassium: 3.9 mmol/L (ref 3.5–5.1)
Sodium: 133 mmol/L — ABNORMAL LOW (ref 135–145)
Total Bilirubin: 1.5 mg/dL — ABNORMAL HIGH (ref 0.3–1.2)
Total Protein: 8.1 g/dL (ref 6.5–8.1)

## 2022-06-02 LAB — RESP PANEL BY RT-PCR (RSV, FLU A&B, COVID)  RVPGX2
Influenza A by PCR: NEGATIVE
Influenza B by PCR: NEGATIVE
Resp Syncytial Virus by PCR: NEGATIVE
SARS Coronavirus 2 by RT PCR: NEGATIVE

## 2022-06-02 MED ORDER — ALBUTEROL SULFATE HFA 108 (90 BASE) MCG/ACT IN AERS: 1.0000 | INHALATION_SPRAY | Freq: Four times a day (QID) | RESPIRATORY_TRACT | 0 refills | Status: DC | PRN

## 2022-06-02 MED ORDER — PREDNISONE 20 MG PO TABS: 40.0000 mg | ORAL_TABLET | Freq: Every day | ORAL | 0 refills | Status: DC

## 2022-06-02 MED ORDER — OXYMETAZOLINE HCL 0.05 % NA SOLN: 1.0000 | Freq: Two times a day (BID) | NASAL | 0 refills | Status: AC

## 2022-06-02 MED ORDER — ONDANSETRON 4 MG PO TBDP
4.0000 mg | ORAL_TABLET | Freq: Once | ORAL | Status: AC
  Administered 2022-06-02: 4 mg via ORAL
  Filled 2022-06-02: qty 1

## 2022-06-02 MED ORDER — LIDOCAINE VISCOUS HCL 2 % MT SOLN
15.0000 mL | Freq: Once | OROMUCOSAL | Status: AC
  Administered 2022-06-02: 15 mL via ORAL
  Filled 2022-06-02: qty 15

## 2022-06-02 MED ORDER — ALUM & MAG HYDROXIDE-SIMETH 200-200-20 MG/5ML PO SUSP
30.0000 mL | Freq: Once | ORAL | Status: AC
  Administered 2022-06-02: 30 mL via ORAL
  Filled 2022-06-02: qty 30

## 2022-06-02 MED ORDER — DOXYCYCLINE HYCLATE 100 MG PO CAPS: 100.0000 mg | ORAL_CAPSULE | Freq: Two times a day (BID) | ORAL | 0 refills | Status: AC

## 2022-06-02 MED ORDER — DOXYCYCLINE HYCLATE 100 MG PO CAPS: 100.0000 mg | ORAL_CAPSULE | Freq: Two times a day (BID) | ORAL | 0 refills | Status: DC

## 2022-06-02 MED ORDER — DEXAMETHASONE SODIUM PHOSPHATE 10 MG/ML IJ SOLN
10.0000 mg | Freq: Once | INTRAMUSCULAR | Status: AC
  Administered 2022-06-02: 10 mg
  Filled 2022-06-02: qty 1

## 2022-06-02 MED ORDER — IPRATROPIUM-ALBUTEROL 0.5-2.5 (3) MG/3ML IN SOLN
3.0000 mL | Freq: Once | RESPIRATORY_TRACT | Status: AC
  Administered 2022-06-02: 3 mL via RESPIRATORY_TRACT
  Filled 2022-06-02: qty 3

## 2022-06-02 MED ORDER — ALBUTEROL SULFATE HFA 108 (90 BASE) MCG/ACT IN AERS: 1.0000 | INHALATION_SPRAY | RESPIRATORY_TRACT | Status: DC | PRN

## 2022-06-02 MED ORDER — PREDNISONE 20 MG PO TABS: 40.0000 mg | ORAL_TABLET | Freq: Every day | ORAL | 0 refills | Status: AC

## 2022-06-02 NOTE — ED Notes (Signed)
Pt transported to CT ?

## 2022-06-02 NOTE — ED Provider Notes (Signed)
[facility] Provider Note  CSN: [id number] Arrival date & time: 06/02/22 [id number]  Chief Complaint(s) Chest Pain  HPI HAMZEH POPWELL is a 57 y.o. male with past medical history as below, significant for alcohol abuse, hypertension, tobacco abuse, thoracic aortic aneurysm who presents to the ED with complaint of altered complaints including left lower chest wall discomfort, nausea and vomiting, body aches, subjective fevers and chills, coughing, congestion, productive cough with yellow/white sputum.  Exertional dyspnea. He smokes cigarettes daily. He drinks etoh daily.  No sick contacts reported.  No suspicious p.o. intake.  He is on p.o. intake without much difficulty but is having some nausea and vomiting over the past 2 to 3 days.  Intermittent diarrhea, no BRBPR or melena, no dysuria.  No abdominal cramping generalized discomfort.  Chest pain primarily to the left lower chest wall that he has had intermittently for years after a rib injury.  Mild exertional dyspnea worsened from baseline, cough is mildly worse from his baseline.  No significant lower extremity swelling in comparison to baseline  Past Medical History Past Medical History:  Diagnosis Date  . Alcohol dependence ([place])   . Hypertension   . Thoracic aortic aneurysm ([place])   . Tobacco dependence    Patient Active Problem List   Diagnosis Date Noted  . Acute hypoxemic respiratory failure due to COVID-19 ([place]) 03/29/2020  . Alcohol dependence ([place])   . Hypertension   . Tobacco dependence   . Thoracic aortic aneurysm ([place])    Home Medication(s) Prior to Admission medications   Medication Sig Start Date End Date Taking? Authorizing Provider  oxymetazoline (AFRIN NASAL SPRAY) 0.05 % nasal spray Place 1 spray into both nostrils 2 (two) times daily for 3 days. 06/02/22 06/05/22 Yes Wynona Dove A, DO  albuterol (VENTOLIN HFA) 108 (90 Base) MCG/ACT inhaler Inhale 2 puffs into the lungs every 2  (two) hours as needed for wheezing or shortness of breath. 04/01/20   Ghimire, Henreitta Leber, MD  albuterol (VENTOLIN HFA) 108 (90 Base) MCG/ACT inhaler INHALE 2 PUFFS INTO THE LUNGS EVERY TWO HOURS AS NEEDED FOR WHEEZING OR SHORTNESS OF BREATH. 04/01/20 04/01/21  Ghimire, Henreitta Leber, MD  albuterol (VENTOLIN HFA) 108 (90 Base) MCG/ACT inhaler Inhale 1-2 puffs into the lungs every 6 (six) hours as needed for wheezing or shortness of breath. 06/02/22   Jeanell Sparrow, DO  carvedilol (COREG) 6.25 MG tablet Take 1 tablet (6.25 mg total) by mouth 2 (two) times daily with a meal. 04/01/20 05/01/20  Ghimire, Henreitta Leber, MD  carvedilol (COREG) 6.25 MG tablet TAKE 1 TABLET (6.25 MG TOTAL) BY MOUTH TWO TIMES DAILY WITH A MEAL. 04/01/20 04/01/21  Ghimire, Henreitta Leber, MD  doxycycline (VIBRAMYCIN) 100 MG capsule Take 1 capsule (100 mg total) by mouth 2 (two) times daily for 7 days. 06/02/22 06/09/22  Jeanell Sparrow, DO  Multiple Vitamin (MULTIVITAMIN WITH MINERALS) TABS tablet Take 1 tablet by mouth daily. 04/02/20   Ghimire, Henreitta Leber, MD  predniSONE (DELTASONE) 10 MG tablet Take 4 tablets daily for 2 days,  Take 3 tablets daily for 2 days, Take 2 tablets  daily for 2 days, Take 1 tablet daily for 1 day-&  stop 04/01/20   Ghimire, Henreitta Leber, MD  predniSONE (DELTASONE) 20 MG tablet Take 2 tablets (40 mg total) by mouth daily for 5 days. 06/02/22 06/07/22  Wynona Dove A, DO  thiamine 100 MG tablet Take 1 tablet (100 mg total) by mouth daily.  04/02/20   Ghimire, Henreitta Leber, MD                                                                                                                                    Past Surgical History History reviewed. No pertinent surgical history. Family History History reviewed. No pertinent family history.  Social History Social History   Tobacco Use  . Smoking status: Every Day    Packs/day: 0.50    Types: Cigarettes  . Smokeless tobacco: Never  Substance Use Topics  . Alcohol use: Yes    Comment: 4-7  per week  . Drug use: No   Allergies Lisinopril  Review of Systems Review of Systems  Constitutional:  Positive for fever. Negative for chills.  HENT:  Negative for facial swelling and trouble swallowing.   Eyes:  Negative for photophobia and visual disturbance.  Respiratory:  Positive for cough and shortness of breath.   Cardiovascular:  Negative for chest pain and palpitations.  Gastrointestinal:  Positive for abdominal pain, diarrhea, nausea and vomiting.  Endocrine: Negative for polydipsia and polyuria.  Genitourinary:  Negative for difficulty urinating and hematuria.  Musculoskeletal:  Positive for arthralgias. Negative for gait problem and joint swelling.  Skin:  Negative for pallor and rash.  Neurological:  Negative for syncope and headaches.  Psychiatric/Behavioral:  Negative for agitation and confusion.     Physical Exam Vital Signs  I have reviewed the triage vital signs BP (!) 145/118 (BP Location: Right Arm)   Pulse (!) 103   Temp 98.9 F (37.2 C) (Oral)   Resp 18   Ht 5\' 11"  (1.803 m)   Wt 74.8 kg   SpO2 96%   BMI 23.01 kg/m  Physical Exam Vitals and nursing note reviewed.  Constitutional:      General: He is not in acute distress.    Appearance: He is well-developed.  HENT:     Head: Normocephalic and atraumatic.     Right Ear: External ear normal.     Left Ear: External ear normal.     Mouth/Throat:     Mouth: Mucous membranes are moist.  Eyes:     General: No scleral icterus. Cardiovascular:     Rate and Rhythm: Regular rhythm. Tachycardia present.     Pulses: Normal pulses.     Heart sounds: Normal heart sounds.     No S3 or S4 sounds.  Pulmonary:     Effort: Pulmonary effort is normal. No respiratory distress.     Breath sounds: Wheezing present.     Comments: Trace wheezing b/l Abdominal:     General: Abdomen is flat.     Palpations: Abdomen is soft.     Tenderness: There is no abdominal tenderness.  Musculoskeletal:        General:  Normal range of motion.     Cervical back: No rigidity.     Right lower leg: No edema.  Left lower leg: No edema.  Skin:    General: Skin is warm and dry.     Capillary Refill: Capillary refill takes less than 2 seconds.  Neurological:     Mental Status: He is alert and oriented to person, place, and time.     GCS: GCS eye subscore is 4. GCS verbal subscore is 5. GCS motor subscore is 6.  Psychiatric:        Mood and Affect: Mood normal.        Behavior: Behavior normal.     ED Results and Treatments Labs (all labs ordered are listed, but only abnormal results are displayed) Labs Reviewed  COMPREHENSIVE METABOLIC PANEL - Abnormal; Notable for the following components:      Result Value   Sodium 133 (*)    CO2 20 (*)    BUN <5 (*)    AST 60 (*)    Total Bilirubin 1.5 (*)    All other components within normal limits  CBC WITH DIFFERENTIAL/PLATELET - Abnormal; Notable for the following components:   MCH 34.2 (*)    All other components within normal limits  RESP PANEL BY RT-PCR (RSV, FLU A&B, COVID)  RVPGX2  TROPONIN I (HIGH SENSITIVITY)  TROPONIN I (HIGH SENSITIVITY)                                                                                                                          Radiology No results found.  Pertinent labs & imaging results that were available during my care of the patient were reviewed by me and considered in my medical decision making (see MDM for details).  Medications Ordered in ED Medications  ipratropium-albuterol (DUONEB) 0.5-2.5 (3) MG/3ML nebulizer solution 3 mL (3 mLs Nebulization Given 06/02/22 1315)  dexamethasone (DECADRON) injection 10 mg (10 mg Other Given 06/02/22 1315)  ondansetron (ZOFRAN-ODT) disintegrating tablet 4 mg (4 mg Oral Given 06/02/22 1338)  alum & mag hydroxide-simeth (MAALOX/MYLANTA) 200-200-20 MG/5ML suspension 30 mL (30 mLs Oral Given 06/02/22 1339)    And  lidocaine (XYLOCAINE) 2 % viscous mouth solution 15 mL (15 mLs  Oral Given 06/02/22 1339)                                                                                                                                     Procedures Procedures  (including critical care time)  Medical Decision Making /  ED Course   MDM:  KIT MOLLETT is a 57 y.o. male with past medical history as below, significant for alcohol abuse, hypertension, tobacco abuse, thoracic aortic aneurysm who presents to the ED with complaint of altered complaints including left lower chest wall discomfort, nausea and vomiting, body aches, subjective fevers and chills, coughing, congestion, productive cough with yellow/white sputum.  Exertional dyspnea.. The complaint involves an extensive differential diagnosis and also carries with it a high risk of complications and morbidity.  Serious etiology was considered. Ddx includes but is not limited to: Differential diagnosis for adult fever includes but is not exclusive to community-acquired pneumonia, urinary tract infection, acute cholecystitis, viral syndrome, cellulitis, tick bourne disease,  decubitus ulcer, necrotizing fasciitis, meningitis, encephalitis, influenza, Differential diagnosis includes but is not exclusive to acute appendicitis, renal colic, testicular torsion, urinary tract infection, prostatitis,  diverticulitis, small bowel obstruction, colitis, abdominal aortic aneurysm, gastroenteritis, constipation etc. Differential includes all life-threatening causes for chest pain. This includes but is not exclusive to acute coronary syndrome, aortic dissection, pulmonary embolism, cardiac tamponade, community-acquired pneumonia, pericarditis, musculoskeletal chest wall pain, etc.   On initial assessment the patient is: He is resting comfortably, no acute distress.  Patient observed ambulating to the restroom without difficulty.  No significant exertional dyspnea noted Vital signs and nursing notes were reviewed  Clinical Course as of  06/03/22 1558  Fri Jun 02, 2022  1505 Pt feeling better, still having some post nasal drip/congestion  [SG]  Sat Jun 03, 2022  1557 MCHC: 34.9 [SG]    Clinical Course User Index [SG] Jeanell Sparrow, DO    Labs reviewed are stable, RVP negative.  Troponin negative x 2.  Chest x-ray stable, CT abdomen pelvis also stable.  Have high degree suspicion for COPD exacerbation.  Encouraged tobacco cessation.  Give albuterol, mucolytic and steroids for home.  Follow-up PCP.  The patient's chest pain is not suggestive of pulmonary embolus, cardiac ischemia, aortic dissection, pericarditis, myocarditis, pulmonary embolism, pneumothorax, pneumonia, Zoster, or esophageal perforation, or other serious etiology.  Historically not abrupt in onset, tearing or ripping, pulses symmetric. EKG nonspecific for ischemia/infarction. No dysrhythmias, brugada, WPW, prolonged QT noted.   Troponin negative x2. CXR reviewed. Labs without demonstration of acute pathology unless otherwise noted above. Low HEART Score: 0-3 points (0.9-1.7% risk of MACE).]   Dyspnea most suggestive of COPD exacerbation. Very low suspicion of alternative serious thoracic etiologies. Treated with NMTs and steroids with improvement in oxygenation and work of breathing. CXR reviewed. Patient improved and appears to be appropriate for OP management with close PCP F/U.  The patient improved significantly and was discharged in stable condition. Detailed discussions were had with the patient regarding current findings, and need for close f/u with PCP or on call doctor. The patient has been instructed to return immediately if the symptoms worsen in any way for re-evaluation. Patient verbalized understanding and is in agreement with current care plan. All questions answered prior to discharge.    Additional history obtained: -Additional history obtained from na -External records from outside source obtained and reviewed including: Chart review  including previous notes, labs, imaging, consultation notes including notes from the [place], home medications, prior labs and imaging   Lab Tests: -I ordered, reviewed, and interpreted labs.   The pertinent results include:   Labs Reviewed  COMPREHENSIVE METABOLIC PANEL - Abnormal; Notable for the following components:      Result Value   Sodium 133 (*)    CO2 20 (*)  BUN <5 (*)    AST 60 (*)    Total Bilirubin 1.5 (*)    All other components within normal limits  CBC WITH DIFFERENTIAL/PLATELET - Abnormal; Notable for the following components:   MCH 34.2 (*)    All other components within normal limits  RESP PANEL BY RT-PCR (RSV, FLU A&B, COVID)  RVPGX2  TROPONIN I (HIGH SENSITIVITY)  TROPONIN I (HIGH SENSITIVITY)    Notable for stable  EKG   EKG Interpretation  Date/Time:  Friday June 02 2022 09:47:32 EST Ventricular Rate:  113 PR Interval:  184 QRS Duration: 78 QT Interval:  322 QTC Calculation: 441 R Axis:   34 Text Interpretation: Sinus tachycardia Left ventricular hypertrophy with repolarization abnormality ( Sokolow-Lyon ) Abnormal ECG When compared with ECG of 29-Mar-2020 07:14, PREVIOUS ECG IS PRESENT no stemi Interpretation limited secondary to artifact Confirmed by Wynona Dove ([id number]) on 06/02/2022 12:08:24 PM         Imaging Studies ordered: I ordered imaging studies including cxr ctap I independently visualized the following imaging with scope of interpretation limited to determining acute life threatening conditions related to emergency care: stable  I independently visualized and interpreted imaging. I agree with the radiologist interpretation   Medicines ordered and prescription drug management: Meds ordered this encounter  Medications  . ipratropium-albuterol (DUONEB) 0.5-2.5 (3) MG/3ML nebulizer solution 3 mL  . dexamethasone (DECADRON) injection 10 mg    Give po please  . ondansetron (ZOFRAN-ODT) disintegrating tablet 4 mg  . AND Linked Order  Group   . alum & mag hydroxide-simeth (MAALOX/MYLANTA) 200-200-20 MG/5ML suspension 30 mL   . lidocaine (XYLOCAINE) 2 % viscous mouth solution 15 mL  . DISCONTD: albuterol (VENTOLIN HFA) 108 (90 Base) MCG/ACT inhaler 1-2 puff  . DISCONTD: albuterol (VENTOLIN HFA) 108 (90 Base) MCG/ACT inhaler    Sig: Inhale 1-2 puffs into the lungs every 6 (six) hours as needed for wheezing or shortness of breath.    Dispense:  1 each    Refill:  0  . DISCONTD: predniSONE (DELTASONE) 20 MG tablet    Sig: Take 2 tablets (40 mg total) by mouth daily for 5 days.    Dispense:  10 tablet    Refill:  0  . DISCONTD: doxycycline (VIBRAMYCIN) 100 MG capsule    Sig: Take 1 capsule (100 mg total) by mouth 2 (two) times daily for 7 days.    Dispense:  14 capsule    Refill:  0  . albuterol (VENTOLIN HFA) 108 (90 Base) MCG/ACT inhaler    Sig: Inhale 1-2 puffs into the lungs every 6 (six) hours as needed for wheezing or shortness of breath.    Dispense:  1 each    Refill:  0  . doxycycline (VIBRAMYCIN) 100 MG capsule    Sig: Take 1 capsule (100 mg total) by mouth 2 (two) times daily for 7 days.    Dispense:  14 capsule    Refill:  0  . predniSONE (DELTASONE) 20 MG tablet    Sig: Take 2 tablets (40 mg total) by mouth daily for 5 days.    Dispense:  10 tablet    Refill:  0  . oxymetazoline (AFRIN NASAL SPRAY) 0.05 % nasal spray    Sig: Place 1 spray into both nostrils 2 (two) times daily for 3 days.    Dispense:  15 mL    Refill:  0    -I have reviewed the patients home medicines and have made adjustments as needed  Consultations Obtained: na   Cardiac Monitoring: The patient was maintained on a cardiac monitor.  I personally viewed and interpreted the cardiac monitored which showed an underlying rhythm of: nsr  Social Determinants of Health:  Diagnosis or treatment significantly limited by social determinants of health: current smoker Counseled patient for approximately 3 minutes regarding smoking  cessation. Discussed risks of smoking and how they applied and affected their visit here today. Patient not ready to quit at this time, however will follow up with their primary doctor when they are.   CPT code: [phone]: intermediate counseling for smoking cessation     Reevaluation: After the interventions noted above, I reevaluated the patient and found that they have improved  Co morbidities that complicate the patient evaluation . Past Medical History:  Diagnosis Date  . Alcohol dependence ([place])   . Hypertension   . Thoracic aortic aneurysm ([place])   . Tobacco dependence       Dispostion: Disposition decision including need for hospitalization was considered, and patient discharged from emergency department.    Final Clinical Impression(s) / ED Diagnoses Final diagnoses:  COPD with acute exacerbation ([place])  Post-nasal drip  Upper respiratory tract infection, unspecified type     This chart was dictated using voice recognition software.  Despite best efforts to proofread,  errors can occur which can change the documentation meaning.    Wynona Dove A, DO 06/03/22 1557

## 2022-06-02 NOTE — Discharge Instructions (Addendum)
It was a pleasure caring for you today in the emergency department.  Please return to the emergency department for any worsening or worrisome symptoms.  Please stop smoking   

## 2022-06-02 NOTE — ED Triage Notes (Signed)
Pt arrived POV from home c/o left sided CP that has been intermittent x2 weeks. Pt endorses some SHOB, sore throat and N/V as well.

## 2022-06-02 NOTE — ED Provider Triage Note (Signed)
Emergency Medicine Provider Triage Evaluation Note  Chad Salas , a 57 y.o. male  was evaluated in triage.  Pt complains of chest pain.  Patient reports intermittent chest pain over the past few weeks of which he most recently experienced 1 week ago.  Patient's more acute complaints seem to involve feelings of bodyaches, sore throat, nausea, vomiting, cough.  Patient reports symptoms for approximately past 3 days.  Denies fever, abdominal pain..  Review of Systems  Positive: See above Negative:   Physical Exam  BP (!) 139/116   Pulse (!) 118   Temp 99.2 F (37.3 C)   Resp 20   Ht 5\' 11"  (1.803 m)   Wt 74.8 kg   SpO2 99%   BMI 23.01 kg/m  Gen:   Awake, no distress   Resp:  Normal effort  MSK:   Moves extremities without difficulty  Other:    Medical Decision Making  Medically screening exam initiated at 10:37 AM.  Appropriate orders placed.  Chad Salas was informed that the remainder of the evaluation will be completed by another provider, this initial triage assessment does not replace that evaluation, and the importance of remaining in the ED until their evaluation is complete.     Wilnette Kales, [place] 06/02/22 1038

## 2022-09-30 ENCOUNTER — Encounter (HOSPITAL_COMMUNITY): Payer: Self-pay

## 2022-09-30 ENCOUNTER — Emergency Department (HOSPITAL_COMMUNITY)
Admission: EM | Admit: 2022-09-30 | Discharge: 2022-09-30 | Disposition: A | Discharge status: Home / Self Care | Payer: Commercial Managed Care - HMO | Attending: Emergency Medicine | Admitting: Emergency Medicine

## 2022-09-30 ENCOUNTER — Emergency Department (HOSPITAL_COMMUNITY): Payer: Commercial Managed Care - HMO

## 2022-09-30 ENCOUNTER — Other Ambulatory Visit: Payer: Self-pay

## 2022-09-30 DIAGNOSIS — Z79899 Other long term (current) drug therapy: Secondary | ICD-10-CM | POA: Insufficient documentation

## 2022-09-30 DIAGNOSIS — F17200 Nicotine dependence, unspecified, uncomplicated: Secondary | ICD-10-CM | POA: Insufficient documentation

## 2022-09-30 DIAGNOSIS — I10 Essential (primary) hypertension: Secondary | ICD-10-CM | POA: Insufficient documentation

## 2022-09-30 DIAGNOSIS — J320 Chronic maxillary sinusitis: Secondary | ICD-10-CM | POA: Insufficient documentation

## 2022-09-30 DIAGNOSIS — G43809 Other migraine, not intractable, without status migrainosus: Secondary | ICD-10-CM | POA: Diagnosis not present

## 2022-09-30 DIAGNOSIS — F17210 Nicotine dependence, cigarettes, uncomplicated: Secondary | ICD-10-CM | POA: Diagnosis not present

## 2022-09-30 DIAGNOSIS — F1010 Alcohol abuse, uncomplicated: Secondary | ICD-10-CM

## 2022-09-30 DIAGNOSIS — Y906 Blood alcohol level of 120-199 mg/100 ml: Secondary | ICD-10-CM | POA: Insufficient documentation

## 2022-09-30 DIAGNOSIS — R519 Headache, unspecified: Secondary | ICD-10-CM | POA: Diagnosis present

## 2022-09-30 LAB — RAPID URINE DRUG SCREEN, HOSP PERFORMED
Amphetamines: NOT DETECTED
Barbiturates: NOT DETECTED
Benzodiazepines: NOT DETECTED
Cocaine: NOT DETECTED
Opiates: NOT DETECTED
Tetrahydrocannabinol: NOT DETECTED

## 2022-09-30 LAB — COMPREHENSIVE METABOLIC PANEL
ALT: 28 U/L (ref 0–44)
AST: 42 U/L — ABNORMAL HIGH (ref 15–41)
Albumin: 3.7 g/dL (ref 3.5–5.0)
Alkaline Phosphatase: 85 U/L (ref 38–126)
Anion gap: 12 (ref 5–15)
BUN: 7 mg/dL (ref 6–20)
CO2: 20 mmol/L — ABNORMAL LOW (ref 22–32)
Calcium: 9 mg/dL (ref 8.9–10.3)
Chloride: 103 mmol/L (ref 98–111)
Creatinine, Ser: 0.98 mg/dL (ref 0.61–1.24)
GFR, Estimated: >60 mL/min (ref >60)
Glucose, Bld: 83 mg/dL (ref 70–99)
Potassium: 4 mmol/L (ref 3.5–5.1)
Sodium: 135 mmol/L (ref 135–145)
Total Bilirubin: 0.7 mg/dL (ref 0.3–1.2)
Total Protein: 7.3 g/dL (ref 6.5–8.1)

## 2022-09-30 LAB — CBC WITH DIFFERENTIAL/PLATELET
Abs Immature Granulocytes: 0.01 10*3/uL (ref 0.00–0.07)
Basophils Absolute: 0 10*3/uL (ref 0.0–0.1)
Basophils Relative: 1 %
Eosinophils Absolute: 0 10*3/uL (ref 0.0–0.5)
Eosinophils Relative: 1 %
HCT: 41.6 % (ref 39.0–52.0)
Hemoglobin: 14 g/dL (ref 13.0–17.0)
Immature Granulocytes: 0 %
Lymphocytes Relative: 64 %
Lymphs Abs: 1.5 10*3/uL (ref 0.7–4.0)
MCH: 32.7 pg (ref 26.0–34.0)
MCHC: 33.7 g/dL (ref 30.0–36.0)
MCV: 97.2 fL (ref 80.0–100.0)
Monocytes Absolute: 0.2 10*3/uL (ref 0.1–1.0)
Monocytes Relative: 9 %
Neutro Abs: 0.6 10*3/uL — ABNORMAL LOW (ref 1.7–7.7)
Neutrophils Relative %: 25 %
Platelets: 135 10*3/uL — ABNORMAL LOW (ref 150–400)
RBC: 4.28 MIL/uL (ref 4.22–5.81)
RDW: 13.2 % (ref 11.5–15.5)
WBC: 2.5 10*3/uL — ABNORMAL LOW (ref 4.0–10.5)
nRBC: 0 % (ref 0.0–0.2)

## 2022-09-30 LAB — ETHANOL: Alcohol, Ethyl (B): 188 mg/dL — ABNORMAL HIGH (ref <10)

## 2022-09-30 LAB — MAGNESIUM: Magnesium: 1.9 mg/dL (ref 1.7–2.4)

## 2022-09-30 LAB — CBG MONITORING, ED: Glucose-Capillary: 102 mg/dL — ABNORMAL HIGH (ref 70–99)

## 2022-09-30 MED ORDER — DIPHENHYDRAMINE HCL 50 MG/ML IJ SOLN
25.0000 mg | Freq: Once | INTRAMUSCULAR | Status: AC
  Administered 2022-09-30: 25 mg via INTRAVENOUS
  Filled 2022-09-30: qty 1

## 2022-09-30 MED ORDER — SODIUM CHLORIDE 0.9 % IV BOLUS
1000.0000 mL | Freq: Once | INTRAVENOUS | Status: AC
  Administered 2022-09-30: 1000 mL via INTRAVENOUS

## 2022-09-30 MED ORDER — METOCLOPRAMIDE HCL 5 MG/ML IJ SOLN
5.0000 mg | Freq: Once | INTRAMUSCULAR | Status: AC
  Administered 2022-09-30: 5 mg via INTRAVENOUS
  Filled 2022-09-30: qty 2

## 2022-09-30 MED ORDER — DOXYCYCLINE HYCLATE 100 MG PO CAPS: 100.0000 mg | ORAL_CAPSULE | Freq: Two times a day (BID) | ORAL | 0 refills | Status: AC

## 2022-09-30 NOTE — ED Triage Notes (Signed)
Pt to ED via EMS from home with c/o left sided weakness onset 3 days ago. Per EMS pt family member called out stating pt was having some left sided weakness, unsteady gait and headache and dizziness. EMS stated family stated pt presented with these symptoms @9a  today after drinking alcohol. Pt reports having HA, dizziness and left side numbness. Pt reports he drinks everyday, has HTN and takes his BP meds. Aox4 in triage, steadu gait noted.

## 2022-09-30 NOTE — ED Provider Notes (Signed)
[place] [facility] AT [facility] Provider Note  CSN: [id number] Arrival date & time: 09/30/22 1705  Chief Complaint(s) Numbness  HPI Chad Salas is a 57 y.o. male with past medical history as below, significant for etoh abuse, htn, thoracic aneurysm, tobacco use who presents to the ED with complaint of headache, arm tingling. Pt arrives from home, pt reports ongoing headache over the past week, he has not had a migraine type headache in >15 yrs per pt but this headache he currently is experiencing does feel similar. Pain to frontal area, throbbing/pulsing, with some radiation to back of his head on left. No vision or heading changes. Feels like he has tingling/sensation change to left side of face and left arm over past week in conjunction w/ headache. Feels weak globally. Symptoms waxing and waning. Reports similar symptoms in the past that resolved spontaneously. Denies any trauma, no fever or chills, no cp or DIB, no abd pain, no melena or brbpr. Does report daily ETOH use 3-4 beers daily, has been drinking today. Denies hx etoh seizure/withdrawal. Also +tobacco use daily  Past Medical History Past Medical History:  Diagnosis Date   Alcohol dependence (HCC)    Hypertension    Thoracic aortic aneurysm (HCC)    Tobacco dependence    Patient Active Problem List   Diagnosis Date Noted   Acute hypoxemic respiratory failure due to COVID-19 (HCC) 03/29/2020   Alcohol dependence (HCC)    Hypertension    Tobacco dependence    Thoracic aortic aneurysm (HCC)    Home Medication(s) Prior to Admission medications   Medication Sig Start Date End Date Taking? Authorizing Provider  albuterol (VENTOLIN HFA) 108 (90 Base) MCG/ACT inhaler Inhale 2 puffs into the lungs every 2 (two) hours as needed for wheezing or shortness of breath. 04/01/20   Ghimire, Werner Lean, MD  albuterol (VENTOLIN HFA) 108 (90 Base) MCG/ACT inhaler INHALE 2 PUFFS INTO THE LUNGS EVERY TWO HOURS AS NEEDED  FOR WHEEZING OR SHORTNESS OF BREATH. 04/01/20 04/01/21  Ghimire, Werner Lean, MD  albuterol (VENTOLIN HFA) 108 (90 Base) MCG/ACT inhaler Inhale 1-2 puffs into the lungs every 6 (six) hours as needed for wheezing or shortness of breath. 06/02/22   Sloan Leiter, DO  carvedilol (COREG) 6.25 MG tablet Take 1 tablet (6.25 mg total) by mouth 2 (two) times daily with a meal. 04/01/20 05/01/20  Ghimire, Werner Lean, MD  carvedilol (COREG) 6.25 MG tablet TAKE 1 TABLET (6.25 MG TOTAL) BY MOUTH TWO TIMES DAILY WITH A MEAL. 04/01/20 04/01/21  Maretta Bees, MD  Multiple Vitamin (MULTIVITAMIN WITH MINERALS) TABS tablet Take 1 tablet by mouth daily. 04/02/20   Ghimire, Werner Lean, MD  predniSONE (DELTASONE) 10 MG tablet Take 4 tablets daily for 2 days,  Take 3 tablets daily for 2 days, Take 2 tablets  daily for 2 days, Take 1 tablet daily for 1 day-&  stop 04/01/20   Maretta Bees, MD  thiamine 100 MG tablet Take 1 tablet (100 mg total) by mouth daily. 04/02/20   Ghimire, Werner Lean, MD  Past Surgical History History reviewed. No pertinent surgical history. Family History History reviewed. No pertinent family history.  Social History Social History   Tobacco Use   Smoking status: Every Day    Packs/day: .5    Types: Cigarettes   Smokeless tobacco: Never  Substance Use Topics   Alcohol use: Yes    Comment: 4-7 per week   Drug use: No   Allergies Lisinopril  Review of Systems Review of Systems  Constitutional:  Negative for chills and fever.  HENT:  Negative for facial swelling and trouble swallowing.   Eyes:  Negative for photophobia and visual disturbance.  Respiratory:  Negative for cough and shortness of breath.   Cardiovascular:  Negative for chest pain and palpitations.  Gastrointestinal:  Negative for abdominal pain, nausea and vomiting.  Endocrine: Negative for  polydipsia and polyuria.  Genitourinary:  Negative for difficulty urinating and hematuria.  Musculoskeletal:  Negative for gait problem and joint swelling.  Skin:  Negative for pallor and rash.  Neurological:  Positive for weakness, numbness and headaches. Negative for syncope.  Psychiatric/Behavioral:  Negative for agitation and confusion.     Physical Exam Vital Signs  I have reviewed the triage vital signs There were no vitals taken for this visit. Physical Exam Vitals and nursing note reviewed.  Constitutional:      General: He is not in acute distress.    Appearance: Normal appearance. He is well-developed.  HENT:     Head: Normocephalic and atraumatic. No raccoon eyes, Battle's sign, right periorbital erythema or left periorbital erythema.     Jaw: There is normal jaw occlusion. No trismus.     Right Ear: External ear normal.     Left Ear: External ear normal.     Mouth/Throat:     Mouth: Mucous membranes are moist.  Eyes:     General: No scleral icterus.    Extraocular Movements: Extraocular movements intact.     Pupils: Pupils are equal, round, and reactive to light.  Cardiovascular:     Rate and Rhythm: Normal rate and regular rhythm.     Pulses: Normal pulses.     Heart sounds: Normal heart sounds.  Pulmonary:     Effort: Pulmonary effort is normal. No respiratory distress.     Breath sounds: Normal breath sounds.  Abdominal:     General: Abdomen is flat.     Palpations: Abdomen is soft.     Tenderness: There is no abdominal tenderness.  Musculoskeletal:        General: Normal range of motion.     Cervical back: Normal range of motion.     Right lower leg: No edema.     Left lower leg: No edema.  Skin:    General: Skin is warm and dry.     Capillary Refill: Capillary refill takes less than 2 seconds.  Neurological:     Mental Status: He is alert and oriented to person, place, and time.     GCS: GCS eye subscore is 4. GCS verbal subscore is 5. GCS motor  subscore is 6.     Cranial Nerves: Cranial nerves 2-12 are intact. No facial asymmetry.     Sensory: Sensation is intact.     Motor: Motor function is intact. No weakness or pronator drift.     Coordination: Coordination is intact.     Gait: Gait is intact. Gait normal.     Comments: Strength 5/5 BLUE BLLE Subjective sensation change to left side of face  and LUE but on exam he has no sensation deficits  Neuro exam is variable in terms of pt compliance   Psychiatric:        Mood and Affect: Mood normal.        Behavior: Behavior normal.     ED Results and Treatments Labs (all labs ordered are listed, but only abnormal results are displayed) Labs Reviewed  CBG MONITORING, ED - Abnormal; Notable for the following components:      Result Value   Glucose-Capillary 102 (*)    All other components within normal limits  CBC WITH DIFFERENTIAL/PLATELET  COMPREHENSIVE METABOLIC PANEL  ETHANOL  MAGNESIUM  RAPID URINE DRUG SCREEN, HOSP PERFORMED                                                                                                                          Radiology CT Head Wo Contrast  Result Date: 09/30/2022 CLINICAL DATA:  New onset headache. Numbness. EXAM: CT HEAD WITHOUT CONTRAST TECHNIQUE: Contiguous axial images were obtained from the base of the skull through the vertex without intravenous contrast. RADIATION DOSE REDUCTION: This exam was performed according to the departmental dose-optimization program which includes automated exposure control, adjustment of the mA and/or kV according to patient size and/or use of iterative reconstruction technique. COMPARISON:  head CT 07/09/2017. FINDINGS: Brain: No intracranial hemorrhage, mass effect, or midline shift. Stable degree of generalized atrophy. No hydrocephalus. The basilar cisterns are patent. Remote lacunar infarcts in bilateral basal ganglia and left frontal white matter. No evidence of territorial infarct or acute ischemia.  No extra-axial or intracranial fluid collection. Vascular: No hyperdense vessel or unexpected calcification. Skull: No fracture or focal lesion. Sinuses/Orbits: No acute finding. Mild mucosal thickening in the left maxillary sinus. No mastoid effusion. Other: None. IMPRESSION: 1. No acute intracranial abnormality. 2. Stable atrophy and chronic small vessel ischemia. Remote lacunar infarcts in the basal ganglia and left frontal white matter. Electronically Signed   By: Narda Rutherford M.D.   On: 09/30/2022 19:28    Pertinent labs & imaging results that were available during my care of the patient were reviewed by me and considered in my medical decision making (see MDM for details).  Medications Ordered in ED Medications  sodium chloride 0.9 % bolus 1,000 mL (has no administration in time range)  metoCLOPramide (REGLAN) injection 5 mg (has no administration in time range)  diphenhydrAMINE (BENADRYL) injection 25 mg (has no administration in time range)  Procedures Procedures  (including critical care time)  Medical Decision Making / ED Course    Medical Decision Making:    KRISTIANO RAYBOULD is a 57 y.o. male with past medical history as below, significant for etoh abuse, htn, thoracic aneurysm, tobacco use who presents to the ED with complaint of headache, arm tingling.. The complaint involves an extensive differential diagnosis and also carries with it a high risk of complications and morbidity.  Serious etiology was considered. Ddx includes but is not limited to: Differential diagnosis includes but is not exclusive to subarachnoid hemorrhage, meningitis, encephalitis, previous head trauma, cavernous venous thrombosis, muscle tension headache, glaucoma, temporal arteritis, migraine or migraine equivalent, etc.   Complete initial physical exam performed, notably the  patient  was NAD, resting comfortably, neuro non-focal, requesting discharge..    Reviewed and confirmed nursing documentation for past medical history, family history, social history.  Vital signs reviewed.    Clinical Course as of 09/30/22 7846  Sat Sep 30, 2022  2026 Symptoms improved following intervention  [SG]    Clinical Course User Index [SG] Sloan Leiter, DO   Labs pending at time of shift change CTH shows prior stroke but no acute changes His symptoms are improving with the improvement of his headahce MRI pending.  High suspicion for complicated migraine as his weakness/numbness have improved with his improvement in headache.  On recheck his neuro exam is non-focal Pt signed out to incoming EDP pending labs/imaging and recheck.     Additional history obtained: -Additional history obtained from na -External records from outside source obtained and reviewed including: Chart review including previous notes, labs, imaging, consultation notes including home meds, prior labs/imaging/prior ed visit   Lab Tests: -I ordered, reviewed, and interpreted labs.   The pertinent results include:   Labs Reviewed  CBG MONITORING, ED - Abnormal; Notable for the following components:      Result Value   Glucose-Capillary 102 (*)    All other components within normal limits  CBC WITH DIFFERENTIAL/PLATELET  COMPREHENSIVE METABOLIC PANEL  ETHANOL  MAGNESIUM  RAPID URINE DRUG SCREEN, HOSP PERFORMED    Notable for cbg stable, labs o/w pending  EKG   EKG Interpretation  Date/Time:    Ventricular Rate:    PR Interval:    QRS Duration:   QT Interval:    QTC Calculation:   R Axis:     Text Interpretation:           Imaging Studies ordered: I ordered imaging studies including CTH  MRI I independently visualized the following imaging with scope of interpretation limited to determining acute life threatening conditions related to emergency care; findings noted above,  significant for [facility] with old cva, no large ich I independently visualized and interpreted imaging. I agree with the radiologist interpretation   Medicines ordered and prescription drug management: Meds ordered this encounter  Medications   sodium chloride 0.9 % bolus 1,000 mL   metoCLOPramide (REGLAN) injection 5 mg   diphenhydrAMINE (BENADRYL) injection 25 mg    -I have reviewed the patients home medicines and have made adjustments as needed   Consultations Obtained: na   Cardiac Monitoring: The patient was maintained on a cardiac monitor.  I personally viewed and interpreted the cardiac monitored which showed an underlying rhythm of: NSR  Social Determinants of Health:  Diagnosis or treatment significantly limited by social determinants of health: former smoker Counseled patient for approximately 3 minutes regarding smoking cessation. Discussed risks of smoking and how  they applied and affected their visit here today. Patient not ready to quit at this time, however will follow up with their primary doctor when they are.   CPT code: 16109: intermediate counseling for smoking cessation     Reevaluation: After the interventions noted above, I reevaluated the patient and found that they have improved  Co morbidities that complicate the patient evaluation  Past Medical History:  Diagnosis Date   Alcohol dependence (HCC)    Hypertension    Thoracic aortic aneurysm (HCC)    Tobacco dependence       Dispostion: Disposition decision including need for hospitalization was considered, and patient disposition pending at time of sign out.    Final Clinical Impression(s) / ED Diagnoses Final diagnoses:  None     This chart was dictated using voice recognition software.  Despite best efforts to proofread,  errors can occur which can change the documentation meaning.

## 2022-09-30 NOTE — ED Provider Notes (Signed)
Patient was signed out to me pending MRI of the brain for headaches and dizziness and intermittent left-sided numbness.  MRI was reviewed by myself, showed no acute CVA, noticing some left maxillary sinus fullness, which correlates to the patient's headache pain on exam.  He reports that he had "teeth pulled" root canals done on the left upper teeth recently.  I will start him on doxycycline for 10 days for potential sinusitis or dental infection.  He is otherwise stable for discharge at this time.  He is calling a ride to take him home.  Headache was improved.   Terald Sleeper, MD 09/30/22 [phone]

## 2022-09-30 NOTE — Discharge Instructions (Addendum)
It was a pleasure caring for you today in the emergency department.  Please return to the emergency department for any worsening or worrisome symptoms.  Your MRI scan showed that you may have a sinus infection in your left maxillary sinus, which is near your left cheekbone.  I prescribed an antibiotic for this.  We do not see signs of stroke on your MRI.

## 2022-10-02 LAB — PATHOLOGIST SMEAR REVIEW

## 2022-10-26 ENCOUNTER — Ambulatory Visit (HOSPITAL_COMMUNITY)
Admission: EM | Admit: 2022-10-26 | Discharge: 2022-10-26 | Disposition: A | Discharge status: Home / Self Care | Payer: Commercial Managed Care - HMO | Attending: Internal Medicine | Admitting: Internal Medicine

## 2022-10-26 ENCOUNTER — Encounter (HOSPITAL_COMMUNITY): Payer: Self-pay | Admitting: *Deleted

## 2022-10-26 DIAGNOSIS — S91011A Laceration without foreign body, right ankle, initial encounter: Secondary | ICD-10-CM | POA: Diagnosis not present

## 2022-10-26 NOTE — Discharge Instructions (Addendum)
Daily wound dressing changes daily with topical antibiotic ointment Please take ibuprofen or Tylenol as needed for pain If you notice worsening redness, swelling, discharge-please return to urgent care to be reevaluated.
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
## 2023-04-12 ENCOUNTER — Other Ambulatory Visit: Payer: Self-pay

## 2023-04-12 ENCOUNTER — Inpatient Hospital Stay (HOSPITAL_COMMUNITY)
Admission: EM | Admit: 2023-04-12 | Discharge: 2023-04-13 | DRG: 683 | Disposition: A | Discharge status: Home / Self Care | Payer: Commercial Managed Care - HMO | Attending: Family Medicine | Admitting: Family Medicine

## 2023-04-12 ENCOUNTER — Encounter (HOSPITAL_COMMUNITY): Payer: Self-pay | Admitting: Emergency Medicine

## 2023-04-12 ENCOUNTER — Emergency Department (HOSPITAL_COMMUNITY): Payer: Commercial Managed Care - HMO

## 2023-04-12 DIAGNOSIS — A084 Viral intestinal infection, unspecified: Secondary | ICD-10-CM | POA: Diagnosis present

## 2023-04-12 DIAGNOSIS — I10 Essential (primary) hypertension: Secondary | ICD-10-CM | POA: Diagnosis present

## 2023-04-12 DIAGNOSIS — N179 Acute kidney failure, unspecified: Secondary | ICD-10-CM | POA: Diagnosis present

## 2023-04-12 DIAGNOSIS — R809 Proteinuria, unspecified: Secondary | ICD-10-CM | POA: Diagnosis present

## 2023-04-12 DIAGNOSIS — D6489 Other specified anemias: Secondary | ICD-10-CM | POA: Diagnosis present

## 2023-04-12 DIAGNOSIS — R112 Nausea with vomiting, unspecified: Secondary | ICD-10-CM

## 2023-04-12 DIAGNOSIS — F1290 Cannabis use, unspecified, uncomplicated: Secondary | ICD-10-CM | POA: Diagnosis present

## 2023-04-12 DIAGNOSIS — F1010 Alcohol abuse, uncomplicated: Secondary | ICD-10-CM | POA: Diagnosis present

## 2023-04-12 DIAGNOSIS — Z79899 Other long term (current) drug therapy: Secondary | ICD-10-CM | POA: Diagnosis not present

## 2023-04-12 DIAGNOSIS — K760 Fatty (change of) liver, not elsewhere classified: Secondary | ICD-10-CM | POA: Diagnosis present

## 2023-04-12 DIAGNOSIS — F17210 Nicotine dependence, cigarettes, uncomplicated: Secondary | ICD-10-CM | POA: Diagnosis present

## 2023-04-12 DIAGNOSIS — E860 Dehydration: Secondary | ICD-10-CM | POA: Diagnosis present

## 2023-04-12 DIAGNOSIS — R1110 Vomiting, unspecified: Secondary | ICD-10-CM | POA: Insufficient documentation

## 2023-04-12 DIAGNOSIS — E871 Hypo-osmolality and hyponatremia: Secondary | ICD-10-CM | POA: Diagnosis present

## 2023-04-12 DIAGNOSIS — R7401 Elevation of levels of liver transaminase levels: Secondary | ICD-10-CM | POA: Diagnosis present

## 2023-04-12 DIAGNOSIS — F1490 Cocaine use, unspecified, uncomplicated: Secondary | ICD-10-CM | POA: Diagnosis present

## 2023-04-12 DIAGNOSIS — R9389 Abnormal findings on diagnostic imaging of other specified body structures: Secondary | ICD-10-CM | POA: Insufficient documentation

## 2023-04-12 DIAGNOSIS — D649 Anemia, unspecified: Secondary | ICD-10-CM | POA: Insufficient documentation

## 2023-04-12 DIAGNOSIS — Z888 Allergy status to other drugs, medicaments and biological substances status: Secondary | ICD-10-CM

## 2023-04-12 DIAGNOSIS — E878 Other disorders of electrolyte and fluid balance, not elsewhere classified: Secondary | ICD-10-CM | POA: Insufficient documentation

## 2023-04-12 DIAGNOSIS — F1090 Alcohol use, unspecified, uncomplicated: Secondary | ICD-10-CM | POA: Diagnosis present

## 2023-04-12 LAB — CBC
HCT: 39.6 % (ref 39.0–52.0)
Hemoglobin: 13.8 g/dL (ref 13.0–17.0)
MCH: 33.7 pg (ref 26.0–34.0)
MCHC: 34.8 g/dL (ref 30.0–36.0)
MCV: 96.8 fL (ref 80.0–100.0)
Platelets: 217 10*3/uL (ref 150–400)
RBC: 4.09 MIL/uL — ABNORMAL LOW (ref 4.22–5.81)
RDW: 12.1 % (ref 11.5–15.5)
WBC: 4.5 10*3/uL (ref 4.0–10.5)
nRBC: 0 % (ref 0.0–0.2)

## 2023-04-12 LAB — HIV ANTIBODY (ROUTINE TESTING W REFLEX): HIV Screen 4th Generation wRfx: NONREACTIVE

## 2023-04-12 LAB — COMPREHENSIVE METABOLIC PANEL
ALT: 37 U/L (ref 0–44)
AST: 55 U/L — ABNORMAL HIGH (ref 15–41)
Albumin: 4.2 g/dL (ref 3.5–5.0)
Alkaline Phosphatase: 102 U/L (ref 38–126)
Anion gap: 21 — ABNORMAL HIGH (ref 5–15)
BUN: 40 mg/dL — ABNORMAL HIGH (ref 6–20)
CO2: 21 mmol/L — ABNORMAL LOW (ref 22–32)
Calcium: 10.5 mg/dL — ABNORMAL HIGH (ref 8.9–10.3)
Chloride: 86 mmol/L — ABNORMAL LOW (ref 98–111)
Creatinine, Ser: 3.54 mg/dL — ABNORMAL HIGH (ref 0.61–1.24)
GFR, Estimated: 19 mL/min — ABNORMAL LOW (ref >60)
Glucose, Bld: 97 mg/dL (ref 70–99)
Potassium: 3 mmol/L — ABNORMAL LOW (ref 3.5–5.1)
Sodium: 128 mmol/L — ABNORMAL LOW (ref 135–145)
Total Bilirubin: 1.4 mg/dL — ABNORMAL HIGH (ref <1.2)
Total Protein: 8.8 g/dL — ABNORMAL HIGH (ref 6.5–8.1)

## 2023-04-12 LAB — BASIC METABOLIC PANEL
Anion gap: 16 — ABNORMAL HIGH (ref 5–15)
BUN: 34 mg/dL — ABNORMAL HIGH (ref 6–20)
CO2: 25 mmol/L (ref 22–32)
Calcium: 10 mg/dL (ref 8.9–10.3)
Chloride: 90 mmol/L — ABNORMAL LOW (ref 98–111)
Creatinine, Ser: 2.88 mg/dL — ABNORMAL HIGH (ref 0.61–1.24)
GFR, Estimated: 25 mL/min — ABNORMAL LOW (ref >60)
Glucose, Bld: 109 mg/dL — ABNORMAL HIGH (ref 70–99)
Potassium: 3.5 mmol/L (ref 3.5–5.1)
Sodium: 131 mmol/L — ABNORMAL LOW (ref 135–145)

## 2023-04-12 LAB — URINALYSIS, ROUTINE W REFLEX MICROSCOPIC
Bacteria, UA: NONE SEEN
Bilirubin Urine: NEGATIVE
Glucose, UA: NEGATIVE mg/dL
Hgb urine dipstick: NEGATIVE
Ketones, ur: NEGATIVE mg/dL
Leukocytes,Ua: NEGATIVE
Nitrite: NEGATIVE
Protein, ur: 30 mg/dL — AB
Specific Gravity, Urine: 1.016 (ref 1.005–1.030)
pH: 5 (ref 5.0–8.0)

## 2023-04-12 LAB — MAGNESIUM: Magnesium: 2.5 mg/dL — ABNORMAL HIGH (ref 1.7–2.4)

## 2023-04-12 LAB — LIPASE, BLOOD: Lipase: 57 U/L — ABNORMAL HIGH (ref 11–51)

## 2023-04-12 MED ORDER — SODIUM CHLORIDE 0.9 % IV BOLUS
1000.0000 mL | Freq: Once | INTRAVENOUS | Status: AC
  Administered 2023-04-12: 1000 mL via INTRAVENOUS

## 2023-04-12 MED ORDER — ENOXAPARIN SODIUM 30 MG/0.3ML IJ SOSY
30.0000 mg | PREFILLED_SYRINGE | INTRAMUSCULAR | Status: DC
  Administered 2023-04-12: 30 mg via SUBCUTANEOUS
  Filled 2023-04-12: qty 0.3

## 2023-04-12 MED ORDER — ONDANSETRON HCL 4 MG/2ML IJ SOLN: 4.0000 mg | Freq: Four times a day (QID) | INTRAMUSCULAR | Status: DC | PRN

## 2023-04-12 MED ORDER — ACETAMINOPHEN 650 MG RE SUPP: 650.0000 mg | Freq: Four times a day (QID) | RECTAL | Status: DC | PRN

## 2023-04-12 MED ORDER — LORAZEPAM 1 MG PO TABS: 1.0000 mg | ORAL_TABLET | ORAL | Status: DC | PRN

## 2023-04-12 MED ORDER — POTASSIUM CHLORIDE 20 MEQ PO PACK
40.0000 meq | PACK | Freq: Once | ORAL | Status: AC
  Administered 2023-04-12: 40 meq via ORAL
  Filled 2023-04-12: qty 2

## 2023-04-12 MED ORDER — ONDANSETRON HCL 4 MG PO TABS: 4.0000 mg | ORAL_TABLET | Freq: Four times a day (QID) | ORAL | Status: DC | PRN

## 2023-04-12 MED ORDER — THIAMINE MONONITRATE 100 MG PO TABS
100.0000 mg | ORAL_TABLET | Freq: Every day | ORAL | Status: DC
  Administered 2023-04-12 – 2023-04-13 (×2): 100 mg via ORAL
  Filled 2023-04-12 (×2): qty 1

## 2023-04-12 MED ORDER — THIAMINE HCL 100 MG/ML IJ SOLN: 100.0000 mg | Freq: Every day | INTRAMUSCULAR | Status: DC

## 2023-04-12 MED ORDER — ADULT MULTIVITAMIN W/MINERALS CH
1.0000 | ORAL_TABLET | Freq: Every day | ORAL | Status: DC
  Administered 2023-04-12 – 2023-04-13 (×2): 1 via ORAL
  Filled 2023-04-12 (×2): qty 1

## 2023-04-12 MED ORDER — SODIUM CHLORIDE 0.9 % IV SOLN: INTRAVENOUS | Status: DC

## 2023-04-12 MED ORDER — FOLIC ACID 1 MG PO TABS
1.0000 mg | ORAL_TABLET | Freq: Every day | ORAL | Status: DC
  Administered 2023-04-12 – 2023-04-13 (×2): 1 mg via ORAL
  Filled 2023-04-12 (×2): qty 1

## 2023-04-12 MED ORDER — ONDANSETRON HCL 4 MG/2ML IJ SOLN
4.0000 mg | Freq: Once | INTRAMUSCULAR | Status: AC
  Administered 2023-04-12: 4 mg via INTRAVENOUS
  Filled 2023-04-12: qty 2

## 2023-04-12 MED ORDER — LACTATED RINGERS IV BOLUS
1000.0000 mL | Freq: Once | INTRAVENOUS | Status: AC
  Administered 2023-04-12: 1000 mL via INTRAVENOUS

## 2023-04-12 MED ORDER — LORAZEPAM 2 MG/ML IJ SOLN: 1.0000 mg | INTRAMUSCULAR | Status: DC | PRN

## 2023-04-12 MED ORDER — ACETAMINOPHEN 325 MG PO TABS
650.0000 mg | ORAL_TABLET | Freq: Four times a day (QID) | ORAL | Status: DC | PRN
  Administered 2023-04-12: 650 mg via ORAL
  Filled 2023-04-12 (×2): qty 2

## 2023-04-12 MED ORDER — LACTATED RINGERS IV SOLN: INTRAVENOUS | Status: AC

## 2023-04-12 NOTE — Assessment & Plan Note (Addendum)
NA 128 and K 3.0, likely in setting of poor PO intake and alcohol use.  K repleted with 40 mEq Kcl. -PM BMP

## 2023-04-12 NOTE — Assessment & Plan Note (Addendum)
Last drink 12/11 PM.  No history of withdrawal seizures, but prolonged high use and at risk for withdrawal.  Asymptomatic on admission.  Does have some transaminitis, AST: ALT ratio <2.  Has known history of MAFLD.  Lipase mildly elevated at 57, no evidence of pancreatitis on CTAP.  Wants to stop drinking, will benefit from cessation counseling prior to discharge. -CIWAs for 72 hours -Thiamine, folate, multivitamin supplementation -AM CMP, CBC -TOC consult for substance use

## 2023-04-12 NOTE — ED Notes (Signed)
ED TO INPATIENT HANDOFF REPORT  ED Nurse Name and Phone #:   S Name/Age/Gender Chad Salas 57 y.o. male Room/Bed: 016C/016C  Code Status   Code Status: Full Code  Home/SNF/Other Home Patient oriented to: self, place, time, and situation Is this baseline? Yes   Triage Complete: Triage complete  Chief Complaint AKI (acute kidney injury) (HCC) [N17.9]  Triage Note Pt came in for NV x 2 weeks, with poor PO intake r/t this.  Pt does not endorse any pain at this time.  Pt states he feels weak and lacking energy r/t this. PT states he feels a little better today but still has trouble with NV.  Pt endorses significant ETOH daily, liquor and beer.    Allergies Allergies  Allergen Reactions   Lisinopril Anaphylaxis    Level of Care/Admitting Diagnosis ED Disposition     ED Disposition  Admit   Condition  --   Comment  Hospital Area: [facility] [[id number]]  Level of Care: Telemetry Medical [[id number]]  May admit patient to [facility] or [facility] if equivalent level of care is available:: No  Covid Evaluation: Asymptomatic - no recent exposure (last 10 days) testing not required  Diagnosis: AKI (acute kidney injury) [facility]) [[id number]]  Admitting Physician: Nelia Shi [[id number]]  Attending Physician: Caro Laroche [[id number]]  Certification:: I certify this patient will need inpatient services for at least 2 midnights  Expected Medical Readiness: 04/14/2023          B Medical/Surgery History Past Medical History:  Diagnosis Date   Alcohol dependence (HCC)    Hypertension    Thoracic aortic aneurysm (HCC)    Tobacco dependence    History reviewed. No pertinent surgical history.   A IV Location/Drains/Wounds Patient Lines/Drains/Airways Status     Active Line/Drains/Airways     Name Placement date Placement time Site Days   Peripheral IV 04/12/23 20 G Anterior;Right Forearm 04/12/23  1035  Forearm  less than 1             Intake/Output Last 24 hours No intake or output data in the 24 hours ending 04/12/23 1422  Labs/Imaging Results for orders placed or performed during the hospital encounter of 04/12/23 (from the past 48 hours)  Lipase, blood     Status: Abnormal   Collection Time: 04/12/23  9:02 AM  Result Value Ref Range   Lipase 57 (H) 11 - 51 U/L    Comment: Performed at [facility], [address], [place], [place] [address]  Comprehensive metabolic panel     Status: Abnormal   Collection Time: 04/12/23  9:02 AM  Result Value Ref Range   Sodium 128 (L) 135 - 145 mmol/L   Potassium 3.0 (L) 3.5 - 5.1 mmol/L   Chloride 86 (L) 98 - 111 mmol/L   CO2 21 (L) 22 - 32 mmol/L   Glucose, Bld 97 70 - 99 mg/dL    Comment: Glucose reference range applies only to samples taken after fasting for at least 8 hours.   BUN 40 (H) 6 - 20 mg/dL   Creatinine, Ser 9.51 (H) 0.61 - 1.24 mg/dL   Calcium 88.4 (H) 8.9 - 10.3 mg/dL   Total Protein 8.8 (H) 6.5 - 8.1 g/dL   Albumin 4.2 3.5 - 5.0 g/dL   AST 55 (H) 15 - 41 U/L   ALT 37 0 - 44 U/L   Alkaline Phosphatase 102 38 - 126 U/L   Total Bilirubin 1.4 (H) <  1.2 mg/dL   GFR, Estimated 19 (L) >60 mL/min    Comment: (NOTE) Calculated using the CKD-EPI Creatinine Equation (2021)    Anion gap 21 (H) 5 - 15    Comment: ELECTROLYTES REPEATED TO VERIFY Performed at [facility], [address] [place], [place] [address]   CBC     Status: Abnormal   Collection Time: 04/12/23  9:02 AM  Result Value Ref Range   WBC 4.5 4.0 - 10.5 K/uL   RBC 4.09 (L) 4.22 - 5.81 MIL/uL   Hemoglobin 13.8 13.0 - 17.0 g/dL   HCT 64.3 32.9 - 51.8 %   MCV 96.8 80.0 - 100.0 fL   MCH 33.7 26.0 - 34.0 pg   MCHC 34.8 30.0 - 36.0 g/dL   RDW 84.1 66.0 - 63.0 %   Platelets 217 150 - 400 K/uL   nRBC 0.0 0.0 - 0.2 %    Comment: Performed at [facility], [address], [place], [place] [address]  Magnesium     Status: Abnormal   Collection Time: 04/12/23  9:02 AM  Result  Value Ref Range   Magnesium 2.5 (H) 1.7 - 2.4 mg/dL    Comment: Performed at [facility], [address] [place], [place] [address]  Urinalysis, Routine w reflex microscopic -Urine, Clean Catch     Status: Abnormal   Collection Time: 04/12/23 10:15 AM  Result Value Ref Range   Color, Urine AMBER (A) YELLOW    Comment: BIOCHEMICALS MAY BE AFFECTED BY COLOR   APPearance HAZY (A) CLEAR   Specific Gravity, Urine 1.016 1.005 - 1.030   pH 5.0 5.0 - 8.0   Glucose, UA NEGATIVE NEGATIVE mg/dL   Hgb urine dipstick NEGATIVE NEGATIVE   Bilirubin Urine NEGATIVE NEGATIVE   Ketones, ur NEGATIVE NEGATIVE mg/dL   Protein, ur 30 (A) NEGATIVE mg/dL   Nitrite NEGATIVE NEGATIVE   Leukocytes,Ua NEGATIVE NEGATIVE   RBC / HPF 0-5 0 - 5 RBC/hpf   WBC, UA 21-50 0 - 5 WBC/hpf   Bacteria, UA NONE SEEN NONE SEEN   Squamous Epithelial / HPF 6-10 0 - 5 /HPF   Hyaline Casts, UA PRESENT     Comment: Performed at [facility], [address], [place], [place] [address]   CT ABDOMEN PELVIS WO CONTRAST Result Date: 04/12/2023 CLINICAL DATA:  Bowel obstruction suspected. Nausea vomiting for 2 weeks. Poor oral intake. EXAM: CT ABDOMEN AND PELVIS WITHOUT CONTRAST TECHNIQUE: Multidetector CT imaging of the abdomen and pelvis was performed following the standard protocol without IV contrast. RADIATION DOSE REDUCTION: This exam was performed according to the departmental dose-optimization program which includes automated exposure control, adjustment of the mA and/or kV according to patient size and/or use of iterative reconstruction technique. COMPARISON:  CT scan abdomen and pelvis from 06/02/2022. FINDINGS: Lower chest: There are new ground-glass opacities in the left lung lower lobe adjacent to the descending thoracic aorta, nonspecific but likely sequela of infection or inflammation. There is a 5 mm heterogeneous part solid nodule in the right lung lower lobe, decreased in size since the prior study from November  2021, and may represent resolving infection or inflammation. The lung bases are otherwise clear. No pleural effusion. The heart is normal in size. No pericardial effusion. Hepatobiliary: The liver is normal in size. Non-cirrhotic configuration. No suspicious mass. No intrahepatic or extrahepatic bile duct dilation. No calcified gallstones. Normal gallbladder wall thickness. No pericholecystic inflammatory changes. Pancreas: Unremarkable. No pancreatic ductal  dilatation or surrounding inflammatory changes. Spleen: Within normal limits. No focal lesion. Adrenals/Urinary Tract: Adrenal glands are unremarkable. No suspicious renal mass within the limitations of this unenhanced exam. No hydronephrosis. No renal or ureteric calculi. Urinary bladder is under distended, precluding optimal assessment. However, no large mass or stones identified. No perivesical fat stranding. Stomach/Bowel: There is a small sliding hiatal hernia. No disproportionate dilation of the small or large bowel loops. No evidence of abnormal bowel wall thickening or inflammatory changes. The appendix is unremarkable. Vascular/Lymphatic: No ascites or pneumoperitoneum. No abdominal or pelvic lymphadenopathy, by size criteria. No aneurysmal dilation of the major abdominal arteries. Reproductive: Normal size prostate. Symmetric seminal vesicles. Other: The visualized soft tissues and abdominal wall are unremarkable. Musculoskeletal: No suspicious osseous lesions. There are mild multilevel degenerative changes in the visualized spine. IMPRESSION: *No acute inflammatory process identified within the abdomen or pelvis. No bowel obstruction. *Multiple other nonacute observations, as described above. Electronically Signed   By: Jules Schick M.D.   On: 04/12/2023 11:51    Pending Labs Unresulted Labs (From admission, onward)     Start     Ordered   04/13/23 0500  Comprehensive metabolic panel  Tomorrow morning,   R        04/12/23 1402   04/13/23  0500  CBC  Tomorrow morning,   R        04/12/23 1402   04/12/23 1358  Sodium, urine, random  Once,   R        04/12/23 1402   04/12/23 1358  Creatinine, urine, random  Once,   R        04/12/23 1402   04/12/23 1357  Basic metabolic panel  Once,   R        04/12/23 1402   04/12/23 1342  HIV Antibody (routine testing w rflx)  (HIV Antibody (Routine testing w reflex) panel)  Once,   R        04/12/23 1402            Vitals/Pain Today's Vitals   04/12/23 1034 04/12/23 1100 04/12/23 1155 04/12/23 1257  BP:  (!) 120/91 124/74   Pulse:  78 72   Resp:  20    Temp:    98.5 F (36.9 C)  TempSrc:    Oral  SpO2:  100%    Weight:      Height:      PainSc: 0-No pain       Isolation Precautions No active isolations  Medications Medications  lactated ringers infusion ( Intravenous New Bag/Given 04/12/23 1421)  thiamine (VITAMIN B1) tablet 100 mg (100 mg Oral Given 04/12/23 1211)    Or  thiamine (VITAMIN B1) injection 100 mg ( Intravenous See Alternative 04/12/23 1211)  folic acid (FOLVITE) tablet 1 mg (1 mg Oral Given 04/12/23 1211)  multivitamin with minerals tablet 1 tablet (1 tablet Oral Given 04/12/23 1211)  enoxaparin (LOVENOX) injection 30 mg (has no administration in time range)  ondansetron (ZOFRAN) tablet 4 mg (has no administration in time range)    Or  ondansetron (ZOFRAN) injection 4 mg (has no administration in time range)  acetaminophen (TYLENOL) tablet 650 mg (has no administration in time range)    Or  acetaminophen (TYLENOL) suppository 650 mg (has no administration in time range)  potassium chloride (KLOR-CON) packet 40 mEq (has no administration in time range)  sodium chloride 0.9 % bolus 1,000 mL (0 mLs Intravenous Stopped 04/12/23 1112)  ondansetron (ZOFRAN) injection 4 mg (  4 mg Intravenous Given 04/12/23 1042)  lactated ringers bolus 1,000 mL (1,000 mLs Intravenous New Bag/Given 04/12/23 1112)    Mobility walks     Focused Assessments Renal  Assessment Handoff:  Hemodialysis Schedule:  Last Hemodialysis date and time:    Restricted appendage:     R Recommendations: See Admitting Provider Note  Report given to:   Additional Notes:

## 2023-04-12 NOTE — ED Triage Notes (Addendum)
Pt came in for NV x 2 weeks, with poor PO intake r/t this.  Pt does not endorse any pain at this time.  Pt states he feels weak and lacking energy r/t this. PT states he feels a little better today but still has trouble with NV.  Pt endorses significant ETOH daily, liquor and beer.

## 2023-04-12 NOTE — ED Notes (Signed)
Pt well appearing upon transport.

## 2023-04-12 NOTE — ED Provider Notes (Signed)
EMERGENCY DEPARTMENT AT [facility] Provider Note   CSN: [id number] Arrival date & time: 04/12/23  2440     History  Chief Complaint  Patient presents with   Abdominal Pain   NV    Chad Salas is a 57 y.o. male.  Patient is a 57 year old male with a history of hypertension, alcohol and tobacco dependence and a thoracic aortic aneurysm who is presenting today with a 2-week history of persistent vomiting and not being able to hold anything down.  He reports his abdomen does not really hurt that bad except when he is about ready to vomit and then the pain goes away.  However he states he can even hold water down at this point.  He is vomiting multiple times during the day.  He has not been able to hold down his blood pressure medication.  He has tried to drink some alcohol and reports most recently was yesterday but he vomited it up.  He is not making much urine.  He cannot remember the last time he had a bowel movement but thinks it was last week.  He has not had fever, cough or congestion.  No prior abdominal surgeries.  He denies history of similar symptoms.  He has been trying to take Tums, Pepto-Bismol and ibuprofen on occasion as well to see if that helps but it has not.  The history is provided by the patient.  Abdominal Pain      Home Medications Prior to Admission medications   Medication Sig Start Date End Date Taking? Authorizing Provider  albuterol (VENTOLIN HFA) 108 (90 Base) MCG/ACT inhaler Inhale 2 puffs into the lungs every 2 (two) hours as needed for wheezing or shortness of breath. 04/01/20   Ghimire, Werner Lean, MD  albuterol (VENTOLIN HFA) 108 (90 Base) MCG/ACT inhaler INHALE 2 PUFFS INTO THE LUNGS EVERY TWO HOURS AS NEEDED FOR WHEEZING OR SHORTNESS OF BREATH. 04/01/20 04/01/21  Ghimire, Werner Lean, MD  albuterol (VENTOLIN HFA) 108 (90 Base) MCG/ACT inhaler Inhale 1-2 puffs into the lungs every 6 (six) hours as needed for wheezing or shortness of  breath. 06/02/22   Sloan Leiter, DO  carvedilol (COREG) 6.25 MG tablet Take 1 tablet (6.25 mg total) by mouth 2 (two) times daily with a meal. 04/01/20 05/01/20  Ghimire, Werner Lean, MD  carvedilol (COREG) 6.25 MG tablet TAKE 1 TABLET (6.25 MG TOTAL) BY MOUTH TWO TIMES DAILY WITH A MEAL. 04/01/20 04/01/21  Maretta Bees, MD  Multiple Vitamin (MULTIVITAMIN WITH MINERALS) TABS tablet Take 1 tablet by mouth daily. 04/02/20   Ghimire, Werner Lean, MD  predniSONE (DELTASONE) 10 MG tablet Take 4 tablets daily for 2 days,  Take 3 tablets daily for 2 days, Take 2 tablets  daily for 2 days, Take 1 tablet daily for 1 day-&  stop 04/01/20   Maretta Bees, MD  thiamine 100 MG tablet Take 1 tablet (100 mg total) by mouth daily. 04/02/20   Ghimire, Werner Lean, MD      Allergies    Lisinopril    Review of Systems   Review of Systems  Gastrointestinal:  Positive for abdominal pain.    Physical Exam Updated Vital Signs BP 124/74   Pulse 72   Temp 97.8 F (36.6 C)   Resp 20   Ht 5\' 11"  (1.803 m)   Wt 72.6 kg   SpO2 100%   BMI 22.32 kg/m  Physical Exam Vitals and nursing note reviewed.  Constitutional:  General: He is not in acute distress.    Appearance: He is well-developed.  HENT:     Head: Normocephalic and atraumatic.     Mouth/Throat:     Mouth: Mucous membranes are dry.  Eyes:     Conjunctiva/sclera: Conjunctivae normal.     Pupils: Pupils are equal, round, and reactive to light.  Cardiovascular:     Rate and Rhythm: Regular rhythm. Tachycardia present.     Heart sounds: No murmur heard. Pulmonary:     Effort: Pulmonary effort is normal. No respiratory distress.     Breath sounds: Normal breath sounds. No wheezing or rales.  Abdominal:     General: There is no distension.     Palpations: Abdomen is soft.     Tenderness: There is no abdominal tenderness. There is no guarding or rebound.  Musculoskeletal:        General: No tenderness. Normal range of motion.     Cervical  back: Normal range of motion and neck supple.  Skin:    General: Skin is warm and dry.     Findings: No erythema or rash.  Neurological:     Mental Status: He is alert and oriented to person, place, and time.  Psychiatric:        Behavior: Behavior normal.     ED Results / Procedures / Treatments   Labs (all labs ordered are listed, but only abnormal results are displayed) Labs Reviewed  LIPASE, BLOOD - Abnormal; Notable for the following components:      Result Value   Lipase 57 (*)    All other components within normal limits  COMPREHENSIVE METABOLIC PANEL - Abnormal; Notable for the following components:   Sodium 128 (*)    Potassium 3.0 (*)    Chloride 86 (*)    CO2 21 (*)    BUN 40 (*)    Creatinine, Ser 3.54 (*)    Calcium 10.5 (*)    Total Protein 8.8 (*)    AST 55 (*)    Total Bilirubin 1.4 (*)    GFR, Estimated 19 (*)    Anion gap 21 (*)    All other components within normal limits  CBC - Abnormal; Notable for the following components:   RBC 4.09 (*)    All other components within normal limits  URINALYSIS, ROUTINE W REFLEX MICROSCOPIC - Abnormal; Notable for the following components:   Color, Urine AMBER (*)    APPearance HAZY (*)    Protein, ur 30 (*)    All other components within normal limits  MAGNESIUM - Abnormal; Notable for the following components:   Magnesium 2.5 (*)    All other components within normal limits    EKG EKG Interpretation Date/Time:  Thursday April 12 2023 10:43:22 EST Ventricular Rate:  76 PR Interval:  193 QRS Duration:  96 QT Interval:  406 QTC Calculation: 457 R Axis:   25  Text Interpretation: Sinus rhythm Probable left ventricular hypertrophy T wave inversion RESOLVED SINCE PREVIOUS Confirmed by Gwyneth Sprout ([id number]) on 04/12/2023 11:12:48 AM  Radiology CT ABDOMEN PELVIS WO CONTRAST Result Date: 04/12/2023 CLINICAL DATA:  Bowel obstruction suspected. Nausea vomiting for 2 weeks. Poor oral intake. EXAM: CT  ABDOMEN AND PELVIS WITHOUT CONTRAST TECHNIQUE: Multidetector CT imaging of the abdomen and pelvis was performed following the standard protocol without IV contrast. RADIATION DOSE REDUCTION: This exam was performed according to the departmental dose-optimization program which includes automated exposure control, adjustment of the mA and/or kV according  to patient size and/or use of iterative reconstruction technique. COMPARISON:  CT scan abdomen and pelvis from 06/02/2022. FINDINGS: Lower chest: There are new ground-glass opacities in the left lung lower lobe adjacent to the descending thoracic aorta, nonspecific but likely sequela of infection or inflammation. There is a 5 mm heterogeneous part solid nodule in the right lung lower lobe, decreased in size since the prior study from November 2021, and may represent resolving infection or inflammation. The lung bases are otherwise clear. No pleural effusion. The heart is normal in size. No pericardial effusion. Hepatobiliary: The liver is normal in size. Non-cirrhotic configuration. No suspicious mass. No intrahepatic or extrahepatic bile duct dilation. No calcified gallstones. Normal gallbladder wall thickness. No pericholecystic inflammatory changes. Pancreas: Unremarkable. No pancreatic ductal dilatation or surrounding inflammatory changes. Spleen: Within normal limits. No focal lesion. Adrenals/Urinary Tract: Adrenal glands are unremarkable. No suspicious renal mass within the limitations of this unenhanced exam. No hydronephrosis. No renal or ureteric calculi. Urinary bladder is under distended, precluding optimal assessment. However, no large mass or stones identified. No perivesical fat stranding. Stomach/Bowel: There is a small sliding hiatal hernia. No disproportionate dilation of the small or large bowel loops. No evidence of abnormal bowel wall thickening or inflammatory changes. The appendix is unremarkable. Vascular/Lymphatic: No ascites or  pneumoperitoneum. No abdominal or pelvic lymphadenopathy, by size criteria. No aneurysmal dilation of the major abdominal arteries. Reproductive: Normal size prostate. Symmetric seminal vesicles. Other: The visualized soft tissues and abdominal wall are unremarkable. Musculoskeletal: No suspicious osseous lesions. There are mild multilevel degenerative changes in the visualized spine. IMPRESSION: *No acute inflammatory process identified within the abdomen or pelvis. No bowel obstruction. *Multiple other nonacute observations, as described above. Electronically Signed   By: Jules Schick M.D.   On: 04/12/2023 11:51    Procedures Procedures    Medications Ordered in ED Medications  lactated ringers infusion (has no administration in time range)  LORazepam (ATIVAN) tablet 1-4 mg (has no administration in time range)    Or  LORazepam (ATIVAN) injection 1-4 mg (has no administration in time range)  thiamine (VITAMIN B1) tablet 100 mg (has no administration in time range)    Or  thiamine (VITAMIN B1) injection 100 mg (has no administration in time range)  folic acid (FOLVITE) tablet 1 mg (has no administration in time range)  multivitamin with minerals tablet 1 tablet (has no administration in time range)  sodium chloride 0.9 % bolus 1,000 mL (0 mLs Intravenous Stopped 04/12/23 1112)  ondansetron (ZOFRAN) injection 4 mg (4 mg Intravenous Given 04/12/23 1042)  lactated ringers bolus 1,000 mL (1,000 mLs Intravenous New Bag/Given 04/12/23 1112)    ED Course/ Medical Decision Making/ A&P                                 Medical Decision Making Amount and/or Complexity of Data Reviewed Labs: ordered. Decision-making details documented in ED Course. Radiology: ordered and independent interpretation performed. Decision-making details documented in ED Course. ECG/medicine tests: ordered and independent interpretation performed. Decision-making details documented in ED Course.  Risk OTC  drugs. Prescription drug management. Decision regarding hospitalization.   Pt with multiple medical problems and comorbidities and presenting today with a complaint that caries a high risk for morbidity and mortality.  Here today with complaint of persistent vomiting.  Patient has benign abdominal exam at this time but will ensure no evidence of obstruction.  Also concern for AKI,  electrolyte abnormalities.  Patient denies any infectious symptoms.  Exam not consistent with cholecystitis, appendicitis, diverticulitis.  Concern for pancreatitis as well. I independently interpreted patient's labs and EKG today and lipase is minimally elevated at 57, CMP today with new AKI with creatinine of 3.5 from baseline of less than 1, anion gap of 21, hyponatremia of 128 and hypokalemia of 3.0, CBC within normal limits.  EKG without acute findings.  Will do CT of the abdomen to ensure none of the above.  Patient started on IV fluids and antiemetics.  Patient will need admission for further care.  AKI is most likely prerenal but may also be coming from the ibuprofen he was trying.  Will place patient on CIWA protocol as he is a chronic alcohol user but at this time no signs of withdrawal.  12:02 PM I have independently visualized and interpreted pt's images today.  CT neg for abd pelvis today.  Radiology reports no acute findings.  Will admit patient for AKI and dehydration.  Discussed this with the patient and he is comfortable with this plan.  Unassigned medicine consulted for admission.        Final Clinical Impression(s) / ED Diagnoses Final diagnoses:  AKI (acute kidney injury) (HCC)  Dehydration  Hyponatremia  Nausea and vomiting, unspecified vomiting type    Rx / DC Orders ED Discharge Orders     None         Gwyneth Sprout, MD 04/12/23 1202

## 2023-04-12 NOTE — Plan of Care (Signed)
FMTS Brief Progress Note  S: Rounded on patient this evening.  Patient notes his only concern is dental pain in which she is frustrated with his outpatient dental office for.  He has been able to take in fluids with his clear liquid diet tonight.  O: BP (!) 109/90 (BP Location: Left Arm)   Pulse 79   Temp 98.9 F (37.2 C) (Oral)   Resp 14   Ht 5\' 11"  (1.803 m)   Wt 72.6 kg   SpO2 100%   BMI 22.32 kg/m   General: Well-appearing, NAD HEENT: Poor dentition with several fillings, no appreciable cavities nor abscesses CV: RRR, no murmurs auscultated Pulm: CTAB, normal WOB Abdomen: Soft, nontender, normoactive bowel sounds  A/P: AKI Improving with IV fluids Cr 3.54>2.88, no recent emesis episodes.  Continue plan per day team.  Alcohol use Stable CIWA score of 0. Continue to monitor CIWA's and plan per day team.  - Orders reviewed. Labs for AM ordered, which was adjusted as needed.   Shelby Mattocks, DO 04/12/2023, 9:01 PM PGY-3, [facility] Night Resident  Please page [phone] with questions.

## 2023-04-12 NOTE — Assessment & Plan Note (Addendum)
Noted 5 mm heterogenous RLL nodule on admission CTAP, decreased in size from November 2021. -Follow up outpatient

## 2023-04-12 NOTE — ED Notes (Signed)
last drink was 0400 this morning and he normally drink 5th of hennessy a day

## 2023-04-12 NOTE — Assessment & Plan Note (Addendum)
Creatinine 3.54, baseline normal (~1).  No evidence of hydronephrosis or renal calculi on CTAP.  Suspect prerenal etiology in setting of intractable vomiting, but will obtain further workup if not improving with IV hydration. -Admit to inpatient, med-tele, attending Dr. Linwood Dibbles -Urine creatinine, urine sodium -Bladder scan x1 to ensure no retention -LR 125 mL/h mIVF -Strict I's and O's, daily weights -PT/OT eval and treat -Consider nephrology consult if not improving with hydration -Repeat PM BMP to follow creatinine and electrolytes -AM CMP, trend creatinine

## 2023-04-12 NOTE — Hospital Course (Addendum)
Chad Salas is a 57 y.o. year old with a history of HTN, alcohol use disorder, cocaine use, marijuana use, who presented with dehydration in the setting of intractable vomiting and was admitted for AKI.  AKI Presented with significant dehydration in the setting of intractable vomiting for approximately 2 weeks, creatinine elevated 3.54, baseline ~1.  Believe severity of AKI due to combination of severe dehydration and intrarenal component given intermittent use of NSAIDs.  Received LR bolus x 2 in ED followed by IV hydration with LR 125 mL/h for 12 hours.  Creatinine trended down and fluids discontinued in favor of oral hydration.  PM BMP recheck showed further downward creatinine trend and patient discharged home tolerating PO intake and well-hydrated.  Intractable vomiting Unclear etiology for 2 weeks of reported intractable vomiting, possible cannabinoid hyperemesis syndrome versus viral gastroenteritis.  Did endorse smoking marijuana multiple times a week.  Did not experience any vomiting after presentation to ED and throughout hospitalization.  Alcohol use disorder Reported alcohol intake of daily 12 pack + 1.5 pint liquor for 30 years.  Lipase mildly elevated at 57, no evidence of pancreatitis on CTAP.   Denied history of DTs, withdrawal seizures and remained asymptomatic this hospitalization.  Last drink night prior to hospitalization.  Placed on CIWA precautions and CIWA scores remained 0 throughout hospitalization x48 hours, did not require taper.  Patient seen by social work, declined resources for cessation.  Abnormal CT scan Noted 5 mm heterogenous RLL nodule on admission CTAP, decreased in size from November 2021, plan outpatient follow up per PCP.  Other chronic conditions were medically managed with home medications and formulary alternatives as necessary (tobacco use disorder, HTN).  PCP Follow-up Recommendations: On CTAP, noted 5 mm heterogenous RLL nodule, decreased in size from  November 2021 Consider naltrexone versus acamprosate for alcohol cessation or other therapy Discuss cessation of marijuana for possible cannabinoid hyperemesis syndrome

## 2023-04-12 NOTE — H&P (Addendum)
Hospital Admission History and Physical Service Pager: [phone]  Patient name: Chad Salas Medical record number: [id number] Date of Birth: [birth year] Age: 57 y.o. Gender: male  Primary Care Provider: Patient, No Pcp Per, reportedly somebody in [place] Consultants: None Code Status: Full code Preferred Emergency Contact: Contact Information     Name Relation Home Work Mobile   PROSPECT,CATHY Aunt   [phone]      Other Contacts   None on File      Chief Complaint: Vomiting  Assessment and Plan: LEVONTE Salas is a 57 y.o. male presenting with AKI in setting of poor oral intake 2/2 intractable vomiting for 2 weeks.  AKI is likely prerenal due to poor PO intake, however with BUN/Cr ~11 and significant elevation from baseline, further workup is warranted to rule out intrarenal pathology with urine sodium and creatinine to calculate FeNa.  Can consider intrarenal pathology given some NSAID use although intermittent.  No evidence of postrenal obstructive pathology or hydronephrosis on CTAP.  No evidence of encephalopathy, making some urine.  Will not consult nephrology at this time and will follow creatinine to ensure improvement.  Differential for this patient's vomiting includes cannabinoid hyperemesis syndrome, viral gastroenteritis, acute versus chronic pancreatitis, and autonomic dysfunction/gastroparesis.  Given frequent marijuana use including during vomiting illness, cannabinoid hyperemesis is possible.  Viral gastroenteritis for 2 weeks would be unusual, but cannot rule out this time.  Acute pancreatitis very unlikely given CTAP, but given mild lipase elevation, chronic pancreatitis is a consideration in the setting of prolonged heavy alcohol use.  Patient is passing gas, but has not had abdominal in 1.5 weeks, so gastroparesis would be a consideration.  However, no evidence of SBO or ileus on CTAP.  Will advance diet cautiously. Assessment & Plan AKI (acute kidney  injury) (HCC) Creatinine 3.54, baseline normal (~1).  No evidence of hydronephrosis or renal calculi on CTAP.  Suspect prerenal etiology in setting of intractable vomiting, but will obtain further workup if not improving with IV hydration. -Admit to inpatient, med-tele, attending Dr. Linwood Dibbles -Urine creatinine, urine sodium -Bladder scan x1 to ensure no retention -LR 125 mL/h mIVF -Strict I's and O's, daily weights -PT/OT eval and treat -Consider nephrology consult if not improving with hydration -Repeat PM BMP to follow creatinine and electrolytes -AM CMP, trend creatinine Intractable vomiting Unclear etiology, as discussed above.  Will continue to follow and advance diet as able. -Ondansetron PO or IV Q6h PRN -Clear liquid diet, advance as tolerated Alcohol use disorder Last drink 12/11 PM.  No history of withdrawal seizures, but prolonged high use and at risk for withdrawal.  Asymptomatic on admission.  Does have some transaminitis, AST: ALT ratio <2.  Has known history of MAFLD.  Lipase mildly elevated at 57, no evidence of pancreatitis on CTAP.  Wants to stop drinking, will benefit from cessation counseling prior to discharge. -CIWAs for 72 hours -Thiamine, folate, multivitamin supplementation -AM CMP, CBC -TOC consult for substance use Electrolyte abnormality NA 128 and K 3.0, likely in setting of poor PO intake and alcohol use.  K repleted with 40 mEq Kcl. -PM BMP Abnormal CT scan Noted 5 mm heterogenous RLL nodule on admission CTAP, decreased in size from November 2021. -Follow up outpatient  Chronic and stable: Tobacco use: Does not want nicotine patch HTN: Hold home HCTZ 25 mg daily in setting of AKI, dehydration  FEN/GI: Clear liquids, advance as tolerated, LR 125 mL/h mIVF VTE Prophylaxis: Enoxaparin, adjusted CrCl <30 dosing  Disposition:  Inpatient, med/tele  History of Present Illness: Chad Salas is a 57 y.o. male with a pertinent PMH of HTN, alcohol use  disorder, cocaine use, marijuana use, and  presenting with dehydration in the setting of 2 weeks vomiting and poor PO intake, occasional ibuprofen use.  Patient reports vomiting saliva, dry heaving with any oral intake.  Was initially producing some food 2 weeks ago, but now just clear saliva.  Dry heaving causes some epigastric pain.  Not able to keep down even water.  States he is very hungry now.  Denies chills, fevers.  No diarrhea.  Last stool 1.5 weeks ago.  No blood in stool or vomit.  No sick contacts.  No vomiting today.  Normally weighs around 165 to 170lb.  No abdominal pain.  Passing gas.  Had two root canals approximately 1 month ago.  Had not been eating due to these issues.  R side of mouth was hurting a lot.  Presented again to dentist Monday 12/2 and diagnosed with gum infection, significant swelling, received unknown antibiotics and begin taking them, finished course.  Timeline of vomiting not entirely clear, may have started just before antibiotics prescribed.  Oral swelling improved with antibiotics and the patient got dental fillings on Monday 12/9.   At baseline, patient drinks 12 pack and pint and half of alcohol per day and has done this for approximately 30 years.  Last alcoholic drink last night but vomited it up, last drink without vomiting was 2 days ago.  Denies history withdrawal seizures or DTs.  Was seen at [facility] ED June 2024 for alcohol use disorder and migraine, no follow-up.  Also endorses cocaine use, last used 1 week ago, and marijuana use twice weekly.  In the ED, lipase was 57, CMP with mild transaminitis, CBC largely unremarkable.  Creatinine elevated to 3.54.  Received 1 L NS bolus.  UA with proteinuria but otherwise unremarkable.  Received ondansetron for nausea.  EKG with normal QTc.  Received another 1 L LR bolus.  CT without pancreatic inflammation or other acute pathology.  FMTS consulted for admission.  Review Of Systems: Per HPI  Pertinent Past  Medical History: HTN AUD Tobacco use MAFLD Remainder reviewed in history tab.   Pertinent Past Surgical History: Tonsils removed in childhood  Remainder reviewed in history tab.  Pertinent Social History: Tobacco use: Current smoker, 1/2 pack per day x ~30 years (15 pack-years) Alcohol use: Daily, 12 pack + 1.5 pint x 30 years Other Substance use: Marijuana (twice per week), cocaine (last 1 week ago) Lives at a home with stepson.  Pertinent Family History: Mom - HTN Dad - cirrhosis liver secondary to alcohol use Grandmother - breast cancer Remainder reviewed in history tab.   Important Outpatient Medications: Hydrochlorothiazide 25mg  daily (taking daily)  Remainder reviewed in medication history.   Objective: BP 124/74   Pulse 72   Temp 97.8 F (36.6 C)   Resp 20   Ht 5\' 11"  (1.803 m)   Wt 72.6 kg   SpO2 100%   BMI 22.32 kg/m   Physical Exam: General: Adult male, deconditioned, resting bed no acute distress.  Alert and oriented x 4. Eyes: Some ?conjunctival injections.  Possible scleral icterus. ENTM: Dry mucous membranes.  Poor dentition.  Missing multiple teeth.  Left lower posterior molars with multiple caries.  No gingivitis, oral swelling. Neck: No JVD. Cardiovascular: RRR.  Normal S1-S2.  No murmur/rub/gallops. Respiratory: CTAB on room air. Gastrointestinal: No tenderness to palpation in all quadrants.  Normoactive bowel sounds.  No HSM. MSK: Equal strength in all 4 extremities bilaterally. Extremities: No peripheral edema bilaterally.  Cap refill 3 seconds.  2+ peripheral pulses bilaterally. Derm: No rashes grossly.  Small skin tag right lateral thoracic back. Neuro: No focal neurological deficit.  PERRLA.  Equal strength bilaterally. Psych: Full range affect, not anxious, agitated.  Normal concentration and attention.  Pleasant, appropriate.  Labs:  CBC BMET  Recent Labs  Lab 04/12/23 0902  WBC 4.5  HGB 13.8  HCT 39.6  PLT 217   Recent Labs   Lab 04/12/23 0902  NA 128*  K 3.0*  CL 86*  CO2 21*  BUN 40*  CREATININE 3.54*  GLUCOSE 97  CALCIUM 10.5*     Pertinent additional labs: -Lipase 57  EKG: NSR.  LVH.  No ischemic changes.  QTcB 457.  Imaging Studies:  -CTAP: No acute inflammatory process identified within the abdomen or pelvis. No bowel obstruction.  No hydronephrosis.  No renal or ureteral calculi.  No gallstones.  5 mm heterogenous RLL nodule, decreased in size from November 2021.  Independently reviewed and agree with radiologist's interpretation.  Dimitry Sharion Dove, MD 04/12/2023, 12:19 PM  PGY-1, [facility] Family Medicine FPTS Intern pager: [phone], text pages welcome Secure chat group [facility] Teaching Service  Upper Level Addendum:   I have seen and evaluated this patient along with Dr. Sharion Dove and reviewed the above note, making necessary revisions as appropriate.  I agree with the medical decision making and physical exam as noted above.   Elberta Fortis, DO PGY-2, [facility] Family Medicine Residency

## 2023-04-12 NOTE — Assessment & Plan Note (Addendum)
Unclear etiology, as discussed above.  Will continue to follow and advance diet as able. -Ondansetron PO or IV Q6h PRN -Clear liquid diet, advance as tolerated

## 2023-04-13 DIAGNOSIS — D649 Anemia, unspecified: Secondary | ICD-10-CM | POA: Insufficient documentation

## 2023-04-13 DIAGNOSIS — N179 Acute kidney failure, unspecified: Secondary | ICD-10-CM | POA: Diagnosis not present

## 2023-04-13 LAB — COMPREHENSIVE METABOLIC PANEL
ALT: 28 U/L (ref 0–44)
AST: 46 U/L — ABNORMAL HIGH (ref 15–41)
Albumin: 3.1 g/dL — ABNORMAL LOW (ref 3.5–5.0)
Alkaline Phosphatase: 76 U/L (ref 38–126)
Anion gap: 10 (ref 5–15)
BUN: 30 mg/dL — ABNORMAL HIGH (ref 6–20)
CO2: 26 mmol/L (ref 22–32)
Calcium: 9.1 mg/dL (ref 8.9–10.3)
Chloride: 94 mmol/L — ABNORMAL LOW (ref 98–111)
Creatinine, Ser: 2.39 mg/dL — ABNORMAL HIGH (ref 0.61–1.24)
GFR, Estimated: 31 mL/min — ABNORMAL LOW (ref >60)
Glucose, Bld: 93 mg/dL (ref 70–99)
Potassium: 3.3 mmol/L — ABNORMAL LOW (ref 3.5–5.1)
Sodium: 130 mmol/L — ABNORMAL LOW (ref 135–145)
Total Bilirubin: 1.2 mg/dL — ABNORMAL HIGH (ref <1.2)
Total Protein: 6.7 g/dL (ref 6.5–8.1)

## 2023-04-13 LAB — BASIC METABOLIC PANEL
Anion gap: 15 (ref 5–15)
BUN: 25 mg/dL — ABNORMAL HIGH (ref 6–20)
CO2: 25 mmol/L (ref 22–32)
Calcium: 9.8 mg/dL (ref 8.9–10.3)
Chloride: 94 mmol/L — ABNORMAL LOW (ref 98–111)
Creatinine, Ser: 2 mg/dL — ABNORMAL HIGH (ref 0.61–1.24)
GFR, Estimated: 38 mL/min — ABNORMAL LOW (ref >60)
Glucose, Bld: 79 mg/dL (ref 70–99)
Potassium: 3.4 mmol/L — ABNORMAL LOW (ref 3.5–5.1)
Sodium: 134 mmol/L — ABNORMAL LOW (ref 135–145)

## 2023-04-13 LAB — CBC
HCT: 32.2 % — ABNORMAL LOW (ref 39.0–52.0)
HCT: 36.5 % — ABNORMAL LOW (ref 39.0–52.0)
Hemoglobin: 11.4 g/dL — ABNORMAL LOW (ref 13.0–17.0)
Hemoglobin: 12.8 g/dL — ABNORMAL LOW (ref 13.0–17.0)
MCH: 33.5 pg (ref 26.0–34.0)
MCH: 33.8 pg (ref 26.0–34.0)
MCHC: 35.4 g/dL (ref 30.0–36.0)
MCHC: 35.1 g/dL (ref 30.0–36.0)
MCV: 94.7 fL (ref 80.0–100.0)
MCV: 96.3 fL (ref 80.0–100.0)
Platelets: 177 10*3/uL (ref 150–400)
Platelets: 194 10*3/uL (ref 150–400)
RBC: 3.4 MIL/uL — ABNORMAL LOW (ref 4.22–5.81)
RBC: 3.79 MIL/uL — ABNORMAL LOW (ref 4.22–5.81)
RDW: 12 % (ref 11.5–15.5)
RDW: 12 % (ref 11.5–15.5)
WBC: 2.9 10*3/uL — ABNORMAL LOW (ref 4.0–10.5)
WBC: 3.5 10*3/uL — ABNORMAL LOW (ref 4.0–10.5)
nRBC: 0 % (ref 0.0–0.2)
nRBC: 0 % (ref 0.0–0.2)

## 2023-04-13 MED ORDER — VITAMIN B-1 100 MG PO TABS: 100.0000 mg | ORAL_TABLET | Freq: Every day | ORAL | 0 refills | Status: AC

## 2023-04-13 MED ORDER — POTASSIUM CHLORIDE CRYS ER 20 MEQ PO TBCR
20.0000 meq | EXTENDED_RELEASE_TABLET | Freq: Once | ORAL | Status: AC
  Administered 2023-04-13: 20 meq via ORAL
  Filled 2023-04-13: qty 1

## 2023-04-13 MED ORDER — ADULT MULTIVITAMIN W/MINERALS CH: 1.0000 | ORAL_TABLET | Freq: Every day | ORAL | 0 refills | Status: AC

## 2023-04-13 MED ORDER — FOLIC ACID 1 MG PO TABS: 1.0000 mg | ORAL_TABLET | Freq: Every day | ORAL | 0 refills | Status: AC

## 2023-04-13 MED ORDER — POTASSIUM CHLORIDE 20 MEQ PO PACK
40.0000 meq | PACK | Freq: Once | ORAL | Status: AC
  Administered 2023-04-13: 40 meq via ORAL

## 2023-04-13 NOTE — Progress Notes (Signed)
PT Cancellation Note  Patient Details Name: Chad Salas MRN: [id number] DOB: 20-Jun-1965   Cancelled Treatment:    Reason Eval/Treat Not Completed: PT screened, no needs identified, will sign off.  Per OT, pt is Independent and likely at baseline.  No PT needs.  Will sign off. 04/13/2023  Jacinto Halim., PT Acute Rehabilitation Services [phone]  (office)   Eliseo Gum Meela Wareing 04/13/2023, 9:56 AM

## 2023-04-13 NOTE — Progress Notes (Addendum)
Daily Progress Note Intern Pager: [phone]  Patient name: Chad Salas Medical record number: [id number] Date of birth: June 29, 1965 Age: 57 y.o. Gender: male  Primary Care Provider: Patient, No Pcp Per Consultants: None Code Status: Full  Pt Overview and Major Events to Date:  12/12 - Admitted   Assessment and Plan: IRBY DOLES is a 57 y.o. male with a pertinent PMH of HTN, alcohol use disorder, cocaine use, marijuana use, who presented with dehydration in the setting of intractable vomiting and was admitted for AKI, now tolerating oral hydration.  Given patient's rapid improvement with IVF, now on oral hydration will recheck p.m. BMP to ensure improving creatinine on oral fluids.  Hemoglobin drop noted and likely dilutional given that all cell lines dropped simultaneously.  Will recheck CBC in PM.  If both creatinine and hemoglobin are stable, patient is stable for discharge. Assessment & Plan AKI (acute kidney injury) (HCC) Creatinine improving to 2.39, (baseline ~1).  Prerenal etiology now supported given rapid improvement with IV hydration.  Now getting PO fluids. -Urine creatinine, urine sodium -Maintenance fluids now stopped, encourage p.o. hydration -Strict I's and O's, daily weights -PT/OT not recommending follow up -Repeat PM BMP to follow creatinine and electrolytes Anemia Acute, likely hemodilutional given in all cell lines decreased simultaneously and patient has received a lot of fluids in a short span of time. -PM CBC -Consult GI if hemoglobin continues trend down Intractable vomiting Unclear etiology, though possibly related to cocaine and marijuana use.  No vomiting since admission.  Now tolerating PO and will hold fluids. -Ondansetron PO or IV Q6h discontinued, no nausea -Full liquid diet, advance as tolerated Alcohol use disorder Last drink 12/11 PM.  CIWAs remain 0>0.  No history of withdrawal seizures or DTs.  Will provide cessation counseling and  support per TOC. -CIWAs for 72 hours -Thiamine, folate, multivitamin supplementation -AM CMP, CBC -TOC consult for substance use Electrolyte abnormality Hyponatremia and hypokalemia persisting, likely in setting of poor PO intake and rehydration.  K repleted. -PM BMP Abnormal CT scan Noted 5 mm heterogenous RLL nodule on admission CTAP, decreased in size from November 2021. -Follow up outpatient  Chronic and Stable Problems: Tobacco use: Does not want nicotine patch HTN: Hold home HCTZ 25 mg daily in setting of AKI, dehydration   FEN/GI: Full liquids, advance as tolerated, now holding IVF now that tolerating PO VTE Prophylaxis: Enoxaparin, adjusted CrCl <30 dosing Dispo: Pending PT recommendations  and AKI resolution.  Subjective:  Patient is feeling well this morning, denies any further nausea/vomiting and has had no abdominal pain.  He is resting and is excited to go home if able later today.  Endorses desire to cut back on marijuana smoking and a sense that it may be related to his persistent nausea and vomiting.  States that he has successfully had a single bowel movement yesterday and feels much better.  States bowel movement was liquid in the setting of only fluid intake, but otherwise appeared normal without any blood.  Objective: Temp:  [97.8 F (36.6 C)-98.9 F (37.2 C)] 97.9 F (36.6 C) (12/13 0728) Pulse Rate:  [63-101] 63 (12/13 0728) Resp:  [14-25] 18 (12/13 0728) BP: (100-124)/(74-104) 117/85 (12/13 0728) SpO2:  [96 %-100 %] 100 % (12/13 0728) Weight:  [66.9 kg-72.6 kg] 66.9 kg (12/13 0422)  Physical Exam: General: Thin adult male, resting in bed, NAD.  Alert and at baseline. Eyes: No mucosal pallor, no scleral icterus.  PERRLA. ENTM: MMM.  Poor  dentition, no oral swelling or lesions. Neck: No JVD. Cardiovascular: RRR.  Normal S1/S2.  No murmurs/rubs/gallops. Respiratory: CTAB.  Normal work of breathing on room air. Gastrointestinal: No tenderness to palpation  in all quadrants.  Normoactive bowel sounds.  No HSM. MSK: Moving all extremities appropriate. Extremities: Capillary refill less than 2 seconds. Derm: No rashes grossly.  No ecchymoses or petechiae. Neuro: No focal neurological deficit.  Alert and oriented x 4. Psych: Pleasant, appropriate.  Normal affect and mood.  Good concentration and attention.  Laboratory: Most recent CBC Lab Results  Component Value Date   WBC 2.9 (L) 04/13/2023   HGB 11.4 (L) 04/13/2023   HCT 32.2 (L) 04/13/2023   MCV 94.7 04/13/2023   PLT 177 04/13/2023   Most recent BMP    Latest Ref Rng & Units 04/13/2023    5:54 AM  BMP  Glucose 70 - 99 mg/dL 93   BUN 6 - 20 mg/dL 30   Creatinine 8.29 - 1.24 mg/dL 5.62   Sodium 130 - 865 mmol/L 130   Potassium 3.5 - 5.1 mmol/L 3.3   Chloride 98 - 111 mmol/L 94   CO2 22 - 32 mmol/L 26   Calcium 8.9 - 10.3 mg/dL 9.1     Other pertinent labs: -None  New Imaging/Diagnostic Tests: -None  Jailyne Chieffo, MD 04/13/2023, 8:02 AM  PGY-1,  Family Medicine FPTS Intern pager: [phone], text pages welcome Secure chat group [facility] Teaching Service

## 2023-04-13 NOTE — Assessment & Plan Note (Addendum)
Acute, likely hemodilutional given in all cell lines decreased simultaneously and patient has received a lot of fluids in a short span of time. -PM CBC -Consult GI if hemoglobin continues trend down

## 2023-04-13 NOTE — Assessment & Plan Note (Addendum)
Creatinine improving to 2.39, (baseline ~1).  Prerenal etiology now supported given rapid improvement with IV hydration.  Now getting PO fluids. -Urine creatinine, urine sodium -Maintenance fluids now stopped, encourage p.o. hydration -Strict I's and O's, daily weights -PT/OT not recommending follow up -Repeat PM BMP to follow creatinine and electrolytes

## 2023-04-13 NOTE — Discharge Summary (Addendum)
Family Medicine Teaching [facility] Discharge Summary  Patient name: Chad Salas Medical record number: [id number] Date of birth: 02-16-1966 Age: 57 y.o. Gender: male Date of Admission: 04/12/2023  Date of Discharge: 04/13/2023 Admitting Physician: Chad Shi, MD  Primary Care Provider: Patient, No Pcp Per Consultants: None  Indication for Hospitalization: AKI, dehydration  Discharge Diagnoses/Problem List:  Principal Problem for Admission: AKI Other Problems addressed during stay:  Principal Problem:   AKI (acute kidney injury) (HCC) Active Problems:   Alcohol use disorder   Intractable vomiting   Abnormal CT scan   Electrolyte abnormality   Anemia   Brief Hospital Course:  AKIEM Salas is a 57 y.o. year old with a history of HTN, alcohol use disorder, cocaine use, marijuana use, who presented with dehydration in the setting of intractable vomiting and was admitted for AKI.  AKI Presented with significant dehydration in the setting of intractable vomiting for approximately 2 weeks, creatinine elevated 3.54, baseline ~1.  Believe severity of AKI due to combination of severe dehydration and intrarenal component given intermittent use of NSAIDs.  Received LR bolus x 2 in ED followed by IV hydration with LR 125 mL/h for 12 hours.  Creatinine trended down and fluids discontinued in favor of oral hydration.  PM BMP recheck showed further downward creatinine trend and patient discharged home tolerating PO intake and well-hydrated.  Intractable vomiting Unclear etiology for 2 weeks of reported intractable vomiting, possible cannabinoid hyperemesis syndrome versus viral gastroenteritis.  Did endorse smoking marijuana multiple times a week.  Did not experience any vomiting after presentation to ED and throughout hospitalization.  Alcohol use disorder Reported alcohol intake of daily 12 pack + 1.5 pint liquor for 30 years.  Lipase mildly elevated at 57, no evidence of  pancreatitis on CTAP.   Denied history of DTs, withdrawal seizures and remained asymptomatic this hospitalization.  Last drink night prior to hospitalization.  Placed on CIWA precautions and CIWA scores remained 0 throughout hospitalization x48 hours, did not require taper.  Patient seen by social work, declined resources for cessation.  Abnormal CT scan Noted 5 mm heterogenous RLL nodule on admission CTAP, decreased in size from November 2021, plan outpatient follow up per PCP.  Other chronic conditions were medically managed with home medications and formulary alternatives as necessary (tobacco use disorder, HTN).  PCP Follow-up Recommendations: On CTAP, noted 5 mm heterogenous RLL nodule, decreased in size from November 2021 Consider naltrexone versus acamprosate for alcohol cessation or other therapy Discuss cessation of marijuana for possible cannabinoid hyperemesis syndrome  Disposition: Home  Discharge Condition: Stable, tolerating PO, no nausea or vomiting  Discharge Exam:  Vitals:   04/13/23 0423 04/13/23 0728  BP: 100/80 117/85  Pulse: 69 63  Resp: 18 18  Temp: 98 F (36.7 C) 97.9 F (36.6 C)  SpO2: 100% 100%   Physical Exam: General: Thin adult male, resting in bed, NAD.  Alert and at baseline. Eyes: No mucosal pallor, no scleral icterus.  PERRLA. ENTM: MMM.  Poor dentition, no oral swelling or lesions. Neck: No JVD. Cardiovascular: RRR.  Normal S1/S2.  No murmurs/rubs/gallops. Respiratory: CTAB.  Normal work of breathing on room air. Gastrointestinal: No tenderness to palpation in all quadrants.  Normoactive bowel sounds.  No HSM. MSK: Moving all extremities appropriate. Extremities: Capillary refill less than 2 seconds. Derm: No rashes grossly.  No ecchymoses or petechiae. Neuro: No focal neurological deficit.  Alert and oriented x 4. Psych: Pleasant, appropriate.  Normal affect and mood.  Good concentration and attention.  Significant Procedures:  None  Significant Labs and Imaging:  Recent Labs  Lab 04/12/23 0902 04/13/23 0554 04/13/23 1448  WBC 4.5 2.9* 3.5*  HGB 13.8 11.4* 12.8*  HCT 39.6 32.2* 36.5*  PLT 217 177 194   Recent Labs  Lab 04/12/23 0902 04/12/23 1643 04/13/23 0554 04/13/23 1448  NA 128* 131* 130* 134*  K 3.0* 3.5 3.3* 3.4*  CL 86* 90* 94* 94*  CO2 21* 25 26 25   GLUCOSE 97 109* 93 79  BUN 40* 34* 30* 25*  CREATININE 3.54* 2.88* 2.39* 2.00*  CALCIUM 10.5* 10.0 9.1 9.8  MG 2.5*  --   --   --   ALKPHOS 102  --  76  --   AST 55*  --  46*  --   ALT 37  --  28  --   ALBUMIN 4.2  --  3.1*  --    -Lipase 57  -EKG: NSR.  LVH.  No ischemic changes.  QTcB 457.  -CTAP: No acute inflammatory process identified within the abdomen or pelvis. No bowel obstruction.  No hydronephrosis.  No renal or ureteral calculi.  No gallstones.  5 mm heterogenous RLL nodule, decreased in size from November 2021.   Results/Tests Pending at Time of Discharge: None  Discharge Medications:  Allergies as of 04/13/2023       Reactions   Lisinopril Anaphylaxis        Medication List     STOP taking these medications    hydrochlorothiazide 25 MG tablet Commonly known as: HYDRODIURIL       TAKE these medications    folic acid 1 MG tablet Commonly known as: FOLVITE Take 1 tablet (1 mg total) by mouth daily. Start taking on: April 14, 2023   multivitamin with minerals Tabs tablet Take 1 tablet by mouth daily. Start taking on: April 14, 2023   thiamine 100 MG tablet Commonly known as: Vitamin B-1 Take 1 tablet (100 mg total) by mouth daily. Start taking on: April 14, 2023        Discharge Instructions: Please refer to Patient Instructions section of EMR for full details.  Patient was counseled important signs and symptoms that should prompt return to medical care, changes in medications, dietary instructions, activity restrictions, and follow up appointments.   Follow-Up Appointments: Future  Appointments  Date Time Provider Department Center  04/16/2023 10:10 AM ACCESS TO CARE POOL [facility]    Sharion Dove, Dimitry, MD 04/13/2023, 4:36 PM PGY-1, [place] [facility]  Upper Level Addendum: I have seen and evaluated this patient along with Dr. Sharion Dove and reviewed the above note, making necessary revisions as appropriate. I agree with the medical decision making and physical exam as noted above. Bess Kinds, MD PGY-2 [facility] Family Medicine Residency

## 2023-04-13 NOTE — Discharge Instructions (Signed)
Dear Aliene Beams,   Thank you for letting us participate in your care! In this section, you will find a brief hospital admission summary of why you were admitted to the hospital, what happened during your admission, your diagnosis/diagnoses, and recommended follow up.   You were diagnosed with kidney injury that improved at you ate and drank more. Please continue to drink a lot of fluids at home.  We will see you Monday in the [facility] to recheck your kidney function but please also see your PCP in the next 1-2 weeks.    POST-HOSPITAL & CARE INSTRUCTIONS We recommend following up with your PCP within 1 week from being discharged from the hospital. Please let PCP/Specialists know of any changes in medications that were made which you will be able to see in the medications section of this packet.  DOCTOR'S APPOINTMENTS & FOLLOW UP Future Appointments  Date Time Provider Department Center  04/16/2023 10:10 AM ACCESS TO CARE POOL [id number]     Thank you for choosing [facility]! Take care and be well!  Family Medicine Teaching Service Inpatient Team Opp  [facility]  [address] [place], [place] [address] [phone]

## 2023-04-13 NOTE — Social Work (Signed)
CSW noting consult for Alcohol use cessation. Pt reported he usually has a 12 pack and a pint and a half of liquor a day. CSW spoke with pt who confirmed this but noted he and his PCP are currently working on options. He stated he had tried a medication that didn't work, but are working on another option. Pt declines resources at this time.

## 2023-04-13 NOTE — Assessment & Plan Note (Signed)
 Noted 5 mm heterogenous RLL nodule on admission CTAP, decreased in size from November 2021. -Follow up outpatient

## 2023-04-13 NOTE — Assessment & Plan Note (Signed)
Last drink 12/11 PM.  CIWAs remain 0>0.  No history of withdrawal seizures or DTs.  Will provide cessation counseling and support per TOC. -CIWAs for 72 hours -Thiamine, folate, multivitamin supplementation -AM CMP, CBC -TOC consult for substance use

## 2023-04-13 NOTE — Assessment & Plan Note (Addendum)
Hyponatremia and hypokalemia persisting, likely in setting of poor PO intake and rehydration.  K repleted. -PM BMP

## 2023-04-13 NOTE — Evaluation (Signed)
Occupational Therapy Evaluation and DC Summary  Patient Details Name: Chad Salas MRN: [id number] DOB: [birth year] Today's Date: 04/13/2023   History of Present Illness 57 yo male presenting on 04/12/23 for 2 week hx of persistent vomiting and poor po intake found to have an AKI.  PMH alcohol use disorder, HTN, tobacco use disorder, thoracic aortic aneurysm   Clinical Impression   Pt admitted for above, he denies any pain and reports his poor po intake has resolved. Pt close to functional baseline and independently completing mobility and ADLs in room, no acute skilled OT needs identified. Pt also has no post acute OT needs at this time, signing off but reconsult if needed.        If plan is discharge home, recommend the following: Other (comment) (prn)    Functional Status Assessment  Patient has not had a recent decline in their functional status  Equipment Recommendations  None recommended by OT    Recommendations for Other Services       Precautions / Restrictions Precautions Precautions: None Precaution Comments: low falls risk Restrictions Weight Bearing Restrictions Per Provider Order: No      Mobility Bed Mobility Overal bed mobility: Independent                  Transfers Overall transfer level: Independent Equipment used: None                      Balance Overall balance assessment: Mild deficits observed, not formally tested                                         ADL either performed or assessed with clinical judgement   ADL Overall ADL's : Independent                                       General ADL Comments: Pt independently ambulating in hall, declined desire to complete ADLs at this time but independently traveled to bathroom to complete ADLs at end of session.     Vision         Perception         Praxis         Pertinent Vitals/Pain Pain Assessment Pain Assessment: No/denies pain      Extremity/Trunk Assessment Upper Extremity Assessment Upper Extremity Assessment: Overall WFL for tasks assessed   Lower Extremity Assessment Lower Extremity Assessment: Overall WFL for tasks assessed   Cervical / Trunk Assessment Cervical / Trunk Assessment: Normal   Communication Communication Communication: No apparent difficulties Cueing Techniques: Verbal cues   Cognition Arousal: Alert Behavior During Therapy: WFL for tasks assessed/performed Overall Cognitive Status: Within Functional Limits for tasks assessed                                 General Comments: Eager to DC     General Comments  VSS    Exercises     Shoulder Instructions      Home Living Family/patient expects to be discharged to:: Private residence Living Arrangements: Other relatives Available Help at Discharge: Family;Available PRN/intermittently Type of Home: House Home Access: Stairs to enter Entrance Stairs-Number of Steps: 2   Home Layout: One level  Bathroom Shower/Tub: Psychologist, counselling;Tub/shower unit   Bathroom Toilet: Standard Bathroom Accessibility: No       Additional Comments: Works for [employer] and city of land[place]      Prior Functioning/Environment Prior Level of Function : Independent/Modified Independent             Mobility Comments: Ind ADLs Comments: Ind        OT Problem List: Other (comment) (deficits resolved)      OT Treatment/Interventions:      OT Goals(Current goals can be found in the care plan section) Acute Rehab OT Goals Patient Stated Goal: To go home OT Goal Formulation: With patient Time For Goal Achievement: 04/27/23 Potential to Achieve Goals: Good  OT Frequency:      Co-evaluation              AM-PAC OT "6 Clicks" Daily Activity     Outcome Measure Help from another person eating meals?: None Help from another person taking care of personal grooming?: None Help from another person toileting,  which includes using toliet, bedpan, or urinal?: None Help from another person bathing (including washing, rinsing, drying)?: None Help from another person to put on and taking off regular upper body clothing?: None Help from another person to put on and taking off regular lower body clothing?: None 6 Click Score: 24   End of Session Nurse Communication: Mobility status  Activity Tolerance: Patient tolerated treatment well Patient left: Other (comment) (Pt went into bathroom)  OT Visit Diagnosis: Other (comment) (nausea and vomitting)                Time: 9629-5284 OT Time Calculation (min): 13 min Charges:  OT General Charges $OT Visit: 1 Visit OT Evaluation $OT Eval Low Complexity: 1 Low  04/13/2023  AB, OTR/L  Acute Rehabilitation Services  Office: [phone]   Chad Salas 04/13/2023, 10:16 AM

## 2023-04-13 NOTE — Assessment & Plan Note (Addendum)
Unclear etiology, though possibly related to cocaine and marijuana use.  No vomiting since admission.  Now tolerating PO and will hold fluids. -Ondansetron PO or IV Q6h discontinued, no nausea -Full liquid diet, advance as tolerated

## 2023-04-16 ENCOUNTER — Inpatient Hospital Stay: Payer: Self-pay

## 2023-10-01 ENCOUNTER — Emergency Department (HOSPITAL_COMMUNITY)
Admission: EM | Admit: 2023-10-01 | Discharge: 2023-10-01 | Discharge status: Against Medical Advice | Attending: Emergency Medicine | Admitting: Emergency Medicine

## 2023-10-01 ENCOUNTER — Other Ambulatory Visit: Payer: Self-pay

## 2023-10-01 ENCOUNTER — Encounter (HOSPITAL_COMMUNITY): Payer: Self-pay | Admitting: Emergency Medicine

## 2023-10-01 DIAGNOSIS — R109 Unspecified abdominal pain: Secondary | ICD-10-CM | POA: Insufficient documentation

## 2023-10-01 DIAGNOSIS — R112 Nausea with vomiting, unspecified: Secondary | ICD-10-CM | POA: Diagnosis not present

## 2023-10-01 DIAGNOSIS — F1010 Alcohol abuse, uncomplicated: Secondary | ICD-10-CM | POA: Insufficient documentation

## 2023-10-01 DIAGNOSIS — Z5321 Procedure and treatment not carried out due to patient leaving prior to being seen by health care provider: Secondary | ICD-10-CM | POA: Insufficient documentation

## 2023-10-01 DIAGNOSIS — Y907 Blood alcohol level of 200-239 mg/100 ml: Secondary | ICD-10-CM | POA: Diagnosis not present

## 2023-10-01 DIAGNOSIS — R197 Diarrhea, unspecified: Secondary | ICD-10-CM | POA: Insufficient documentation

## 2023-10-01 LAB — COMPREHENSIVE METABOLIC PANEL WITH GFR
ALT: 36 U/L (ref 0–44)
AST: 85 U/L — ABNORMAL HIGH (ref 15–41)
Albumin: 4.1 g/dL (ref 3.5–5.0)
Alkaline Phosphatase: 108 U/L (ref 38–126)
Anion gap: 16 — ABNORMAL HIGH (ref 5–15)
BUN: 6 mg/dL (ref 6–20)
CO2: 17 mmol/L — ABNORMAL LOW (ref 22–32)
Calcium: 9.3 mg/dL (ref 8.9–10.3)
Chloride: 106 mmol/L (ref 98–111)
Creatinine, Ser: 0.92 mg/dL (ref 0.61–1.24)
GFR, Estimated: >60 mL/min (ref >60)
Glucose, Bld: 84 mg/dL (ref 70–99)
Potassium: 5.7 mmol/L — ABNORMAL HIGH (ref 3.5–5.1)
Sodium: 139 mmol/L (ref 135–145)
Total Bilirubin: 1.6 mg/dL — ABNORMAL HIGH (ref 0.0–1.2)
Total Protein: 8.6 g/dL — ABNORMAL HIGH (ref 6.5–8.1)

## 2023-10-01 LAB — CBC WITH DIFFERENTIAL/PLATELET
Abs Immature Granulocytes: 0 10*3/uL (ref 0.00–0.07)
Basophils Absolute: 0 10*3/uL (ref 0.0–0.1)
Basophils Relative: 1 %
Eosinophils Absolute: 0 10*3/uL (ref 0.0–0.5)
Eosinophils Relative: 0 %
HCT: 44.1 % (ref 39.0–52.0)
Hemoglobin: 14.3 g/dL (ref 13.0–17.0)
Immature Granulocytes: 0 %
Lymphocytes Relative: 52 %
Lymphs Abs: 1.5 10*3/uL (ref 0.7–4.0)
MCH: 33.8 pg (ref 26.0–34.0)
MCHC: 32.4 g/dL (ref 30.0–36.0)
MCV: 104.3 fL — ABNORMAL HIGH (ref 80.0–100.0)
Monocytes Absolute: 0.3 10*3/uL (ref 0.1–1.0)
Monocytes Relative: 11 %
Neutro Abs: 1.1 10*3/uL — ABNORMAL LOW (ref 1.7–7.7)
Neutrophils Relative %: 36 %
Platelets: 230 10*3/uL (ref 150–400)
RBC: 4.23 MIL/uL (ref 4.22–5.81)
RDW: 14.4 % (ref 11.5–15.5)
WBC: 3 10*3/uL — ABNORMAL LOW (ref 4.0–10.5)
nRBC: 0 % (ref 0.0–0.2)

## 2023-10-01 LAB — LIPASE, BLOOD: Lipase: 24 U/L (ref 11–51)

## 2023-10-01 LAB — ETHANOL: Alcohol, Ethyl (B): 233 mg/dL — ABNORMAL HIGH (ref <15)

## 2023-10-01 MED ORDER — ONDANSETRON 4 MG PO TBDP: 4.0000 mg | ORAL_TABLET | Freq: Once | ORAL | Status: DC

## 2023-10-01 NOTE — ED Provider Triage Note (Signed)
 Emergency Medicine Provider Triage Evaluation Note  Chad Salas , a 58 y.o. male  was evaluated in triage.  Pt complains of abd pain, NVD that started this morning. 3 episodes of vomiting today with some blood clots  Last drank a beer this morning. Normally drink "all I can get" daily  Review of Systems  Positive: See hpi Negative: fever  Physical Exam  BP 127/83   Pulse 80   Temp 98 F (36.7 C)   Resp 16   SpO2 100%  Gen:   Awake, no distress   Resp:  Normal effort  MSK:   Moves extremities without difficulty  Other:    Medical Decision Making  Medically screening exam initiated at 5:08 PM.  Appropriate orders placed.  LUDIE PAVLIK was informed that the remainder of the evaluation will be completed by another provider, this initial triage assessment does not replace that evaluation, and the importance of remaining in the ED until their evaluation is complete.  Labs and ct ordered   Royann Cords, [place] 10/01/23 1710

## 2023-10-01 NOTE — ED Notes (Signed)
 Pt did not want to wait any longer. RN notified.

## 2023-10-01 NOTE — ED Triage Notes (Signed)
 Patient BIB EMS from home c/o abdominal pain x 1 day. Patient report nausea and vomiting x 3 today. Patient report vomiting blood with clots today. ETOH on board. Patient stated he drink 3-4 bears today. Hx alcohol abuse.

## 2024-01-06 ENCOUNTER — Emergency Department (HOSPITAL_COMMUNITY)

## 2024-01-06 ENCOUNTER — Other Ambulatory Visit: Payer: Self-pay

## 2024-01-06 ENCOUNTER — Emergency Department (HOSPITAL_COMMUNITY)
Admission: EM | Admit: 2024-01-06 | Discharge: 2024-01-06 | Disposition: A | Discharge status: Home / Self Care | Attending: Emergency Medicine | Admitting: Emergency Medicine

## 2024-01-06 DIAGNOSIS — K2900 Acute gastritis without bleeding: Secondary | ICD-10-CM | POA: Insufficient documentation

## 2024-01-06 DIAGNOSIS — S2232XA Fracture of one rib, left side, initial encounter for closed fracture: Secondary | ICD-10-CM | POA: Insufficient documentation

## 2024-01-06 DIAGNOSIS — R0789 Other chest pain: Secondary | ICD-10-CM | POA: Diagnosis present

## 2024-01-06 DIAGNOSIS — X58XXXA Exposure to other specified factors, initial encounter: Secondary | ICD-10-CM | POA: Diagnosis not present

## 2024-01-06 DIAGNOSIS — I10 Essential (primary) hypertension: Secondary | ICD-10-CM | POA: Insufficient documentation

## 2024-01-06 LAB — D-DIMER, QUANTITATIVE: D-Dimer, Quant: 0.75 ug{FEU}/mL — ABNORMAL HIGH (ref 0.00–0.50)

## 2024-01-06 LAB — CBC WITH DIFFERENTIAL/PLATELET
Abs Immature Granulocytes: 0.01 K/uL (ref 0.00–0.07)
Basophils Absolute: 0 K/uL (ref 0.0–0.1)
Basophils Relative: 1 %
Eosinophils Absolute: 0 K/uL (ref 0.0–0.5)
Eosinophils Relative: 0 %
HCT: 39.5 % (ref 39.0–52.0)
Hemoglobin: 13.1 g/dL (ref 13.0–17.0)
Immature Granulocytes: 0 %
Lymphocytes Relative: 20 %
Lymphs Abs: 0.6 K/uL — ABNORMAL LOW (ref 0.7–4.0)
MCH: 33.6 pg (ref 26.0–34.0)
MCHC: 33.2 g/dL (ref 30.0–36.0)
MCV: 101.3 fL — ABNORMAL HIGH (ref 80.0–100.0)
Monocytes Absolute: 0.3 K/uL (ref 0.1–1.0)
Monocytes Relative: 11 %
Neutro Abs: 2.1 K/uL (ref 1.7–7.7)
Neutrophils Relative %: 68 %
Platelets: 177 K/uL (ref 150–400)
RBC: 3.9 MIL/uL — ABNORMAL LOW (ref 4.22–5.81)
RDW: 13.4 % (ref 11.5–15.5)
WBC: 3.1 K/uL — ABNORMAL LOW (ref 4.0–10.5)
nRBC: 0 % (ref 0.0–0.2)

## 2024-01-06 LAB — COMPREHENSIVE METABOLIC PANEL WITH GFR
ALT: 28 U/L (ref 0–44)
AST: 54 U/L — ABNORMAL HIGH (ref 15–41)
Albumin: 3.6 g/dL (ref 3.5–5.0)
Alkaline Phosphatase: 77 U/L (ref 38–126)
Anion gap: 12 (ref 5–15)
BUN: 9 mg/dL (ref 6–20)
CO2: 21 mmol/L — ABNORMAL LOW (ref 22–32)
Calcium: 8.8 mg/dL — ABNORMAL LOW (ref 8.9–10.3)
Chloride: 102 mmol/L (ref 98–111)
Creatinine, Ser: 0.99 mg/dL (ref 0.61–1.24)
GFR, Estimated: >60 mL/min (ref >60)
Glucose, Bld: 86 mg/dL (ref 70–99)
Potassium: 3.8 mmol/L (ref 3.5–5.1)
Sodium: 135 mmol/L (ref 135–145)
Total Bilirubin: 0.8 mg/dL (ref 0.0–1.2)
Total Protein: 7.1 g/dL (ref 6.5–8.1)

## 2024-01-06 LAB — I-STAT CHEM 8, ED
BUN: 9 mg/dL (ref 6–20)
Calcium, Ion: 1.15 mmol/L (ref 1.15–1.40)
Chloride: 106 mmol/L (ref 98–111)
Creatinine, Ser: 1.1 mg/dL (ref 0.61–1.24)
Glucose, Bld: 82 mg/dL (ref 70–99)
HCT: 41 % (ref 39.0–52.0)
Hemoglobin: 13.9 g/dL (ref 13.0–17.0)
Potassium: 3.9 mmol/L (ref 3.5–5.1)
Sodium: 140 mmol/L (ref 135–145)
TCO2: 21 mmol/L — ABNORMAL LOW (ref 22–32)

## 2024-01-06 LAB — LIPASE, BLOOD: Lipase: 22 U/L (ref 11–51)

## 2024-01-06 LAB — RESP PANEL BY RT-PCR (RSV, FLU A&B, COVID)  RVPGX2
Influenza A by PCR: NEGATIVE
Influenza B by PCR: NEGATIVE
Resp Syncytial Virus by PCR: NEGATIVE
SARS Coronavirus 2 by RT PCR: NEGATIVE

## 2024-01-06 LAB — TROPONIN I (HIGH SENSITIVITY)
Troponin I (High Sensitivity): 6 ng/L (ref <18)
Troponin I (High Sensitivity): 9 ng/L (ref <18)

## 2024-01-06 LAB — MAGNESIUM: Magnesium: 1.6 mg/dL — ABNORMAL LOW (ref 1.7–2.4)

## 2024-01-06 MED ORDER — ONDANSETRON HCL 4 MG/2ML IJ SOLN
4.0000 mg | Freq: Once | INTRAMUSCULAR | Status: AC
  Administered 2024-01-06: 4 mg via INTRAVENOUS
  Filled 2024-01-06: qty 2

## 2024-01-06 MED ORDER — MAGNESIUM OXIDE -MG SUPPLEMENT 400 (240 MG) MG PO TABS
400.0000 mg | ORAL_TABLET | Freq: Once | ORAL | Status: AC
  Administered 2024-01-06: 400 mg via ORAL
  Filled 2024-01-06: qty 1

## 2024-01-06 MED ORDER — ALUM & MAG HYDROXIDE-SIMETH 200-200-20 MG/5ML PO SUSP
30.0000 mL | Freq: Once | ORAL | Status: AC
  Administered 2024-01-06: 30 mL via ORAL
  Filled 2024-01-06: qty 30

## 2024-01-06 MED ORDER — PANTOPRAZOLE SODIUM 20 MG PO TBEC
20.0000 mg | DELAYED_RELEASE_TABLET | Freq: Every day | ORAL | 0 refills | Status: AC
Start: 2024-01-06 — End: 2024-02-05

## 2024-01-06 MED ORDER — IOHEXOL 350 MG/ML SOLN
75.0000 mL | Freq: Once | INTRAVENOUS | Status: AC | PRN
  Administered 2024-01-06: 75 mL via INTRAVENOUS

## 2024-01-06 MED ORDER — FAMOTIDINE 20 MG PO TABS: 20.0000 mg | ORAL_TABLET | Freq: Two times a day (BID) | ORAL | 0 refills | Status: AC

## 2024-01-06 MED ORDER — FENTANYL CITRATE PF 50 MCG/ML IJ SOSY
50.0000 ug | PREFILLED_SYRINGE | Freq: Once | INTRAMUSCULAR | Status: AC
  Administered 2024-01-06: 50 ug via INTRAVENOUS
  Filled 2024-01-06: qty 1

## 2024-01-06 MED ORDER — FAMOTIDINE IN NACL 20-0.9 MG/50ML-% IV SOLN
20.0000 mg | Freq: Once | INTRAVENOUS | Status: AC
  Administered 2024-01-06: 20 mg via INTRAVENOUS
  Filled 2024-01-06: qty 50

## 2024-01-06 MED ORDER — LIDOCAINE VISCOUS HCL 2 % MT SOLN
15.0000 mL | Freq: Once | OROMUCOSAL | Status: AC
  Administered 2024-01-06: 15 mL via ORAL
  Filled 2024-01-06: qty 15

## 2024-01-06 MED ORDER — MORPHINE SULFATE (PF) 4 MG/ML IV SOLN
4.0000 mg | Freq: Once | INTRAVENOUS | Status: AC
  Administered 2024-01-06: 4 mg via INTRAVENOUS
  Filled 2024-01-06: qty 1

## 2024-01-06 MED ORDER — LACTATED RINGERS IV BOLUS
1000.0000 mL | Freq: Once | INTRAVENOUS | Status: AC
  Administered 2024-01-06: 1000 mL via INTRAVENOUS

## 2024-01-06 MED ORDER — HYDROCODONE-ACETAMINOPHEN 5-325 MG PO TABS: 2.0000 | ORAL_TABLET | ORAL | 0 refills | Status: AC | PRN

## 2024-01-06 MED ORDER — IOHEXOL 350 MG/ML SOLN
100.0000 mL | Freq: Once | INTRAVENOUS | Status: AC | PRN
  Administered 2024-01-06: 100 mL via INTRAVENOUS

## 2024-01-06 NOTE — ED Provider Notes (Signed)
 [place] EMERGENCY DEPARTMENT AT [facility] Provider Note   CSN: [id number] Arrival date & time: 01/06/24  9277     Patient presents with: Abdominal Pain   Chad Salas is a 58 y.o. male.    Abdominal Pain Associated symptoms: chest pain      58 year old male with medical history significant for alcohol dependence, hypertension, thoracic aortic aneurysm (4cm in 2021) who presents to the emergency ferment with left-sided chest pain.  The patient states that he drinks around 10 airplane bottles a day.  He states that yesterday evening he developed left upper quadrant pain that is sharp and worse with deep breathing.  He endorses nausea and vomiting, denies any hematemesis or coffee-ground emesis.  He states that his pain is located in the left upper quadrant and radiates up to the left side of his chest.  He denies any ripping or tearing pain, no radiation to the back.  He denies cough, fever or chills.  He denies any history of DVT or PE.  He is not on any medication for GERD.  Prior to Admission medications   Medication Sig Start Date End Date Taking? Authorizing Provider  folic acid  (FOLVITE ) 1 MG tablet Take 1 tablet (1 mg total) by mouth daily. 04/14/23   Shitarev, Dimitry, MD  Multiple Vitamin (MULTIVITAMIN WITH MINERALS) TABS tablet Take 1 tablet by mouth daily. 04/14/23   Shitarev, Dimitry, MD  thiamine  (VITAMIN B-1) 100 MG tablet Take 1 tablet (100 mg total) by mouth daily. 04/14/23   Shitarev, Dimitry, MD    Allergies: Lisinopril    Review of Systems  Cardiovascular:  Positive for chest pain.  Gastrointestinal:  Positive for abdominal pain.  All other systems reviewed and are negative.   Updated Vital Signs BP (!) 169/122   Pulse (!) 59   Temp 98.7 F (37.1 C) (Oral)   Resp 19   Ht 5' 11 (1.803 m)   Wt 77.1 kg   SpO2 100%   BMI 23.71 kg/m   Physical Exam Vitals and nursing note reviewed.  Constitutional:      General: He is not in acute  distress.    Appearance: He is well-developed.  HENT:     Head: Normocephalic and atraumatic.  Eyes:     Conjunctiva/sclera: Conjunctivae normal.  Cardiovascular:     Rate and Rhythm: Normal rate and regular rhythm.     Pulses: Normal pulses.     Heart sounds: No murmur heard. Pulmonary:     Effort: Pulmonary effort is normal. No respiratory distress.     Breath sounds: Normal breath sounds.  Abdominal:     Palpations: Abdomen is soft.     Tenderness: There is abdominal tenderness in the left upper quadrant.  Musculoskeletal:        General: No swelling.     Cervical back: Neck supple.  Skin:    General: Skin is warm and dry.     Capillary Refill: Capillary refill takes less than 2 seconds.  Neurological:     Mental Status: He is alert.  Psychiatric:        Mood and Affect: Mood normal.     (all labs ordered are listed, but only abnormal results are displayed) Labs Reviewed  CBC WITH DIFFERENTIAL/PLATELET - Abnormal; Notable for the following components:      Result Value   WBC 3.1 (*)    RBC 3.90 (*)    MCV 101.3 (*)    Lymphs Abs 0.6 (*)  All other components within normal limits  D-DIMER, QUANTITATIVE - Abnormal; Notable for the following components:   D-Dimer, Quant 0.75 (*)    All other components within normal limits  COMPREHENSIVE METABOLIC PANEL WITH GFR - Abnormal; Notable for the following components:   CO2 21 (*)    Calcium 8.8 (*)    AST 54 (*)    All other components within normal limits  MAGNESIUM  - Abnormal; Notable for the following components:   Magnesium  1.6 (*)    All other components within normal limits  I-STAT CHEM 8, ED - Abnormal; Notable for the following components:   TCO2 21 (*)    All other components within normal limits  RESP PANEL BY RT-PCR (RSV, FLU A&B, COVID)  RVPGX2  LIPASE, BLOOD  TROPONIN I (HIGH SENSITIVITY)    EKG: EKG Interpretation Date/Time:  Sunday January 06 2024 07:59:39 EDT Ventricular Rate:  63 PR  Interval:  223 QRS Duration:  89 QT Interval:  453 QTC Calculation: 464 R Axis:   35  Text Interpretation: Sinus rhythm Prolonged PR interval Left ventricular hypertrophy No significant change since last tracing Confirmed by Jerrol Agent ([id number]) on 01/06/2024 8:07:04 AM  Radiology: CT Angio Chest PE W and/or Wo Contrast Result Date: 01/06/2024 CLINICAL DATA:  Left upper quadrant pain associated with nausea and vomiting with positive D-dimer and suspicion for pulmonary embolism EXAM: CT ANGIOGRAPHY CHEST WITH CONTRAST TECHNIQUE: Multidetector CT imaging of the chest was performed using the standard protocol during bolus administration of intravenous contrast. Multiplanar CT image reconstructions and MIPs were obtained to evaluate the vascular anatomy. RADIATION DOSE REDUCTION: This exam was performed according to the departmental dose-optimization program which includes automated exposure control, adjustment of the mA and/or kV according to patient size and/or use of iterative reconstruction technique. CONTRAST:  75mL OMNIPAQUE  IOHEXOL  350 MG/ML SOLN COMPARISON:  Same day chest radiograph, CTA chest dated 03/29/2020 FINDINGS: Cardiovascular: The study is high quality for the evaluation of pulmonary embolism. There are no filling defects in the central, lobar, segmental or subsegmental pulmonary artery branches to suggest acute pulmonary embolism. Ascending thoracic aortic aneurysm measures 4.3 x 4.3 cm, unchanged. Normal heart size. No significant pericardial fluid/thickening. Mediastinum/Nodes: Imaged thyroid gland without nodules meeting criteria for imaging follow-up by size. Normal esophagus. No pathologically enlarged axillary, supraclavicular, mediastinal, or hilar lymph nodes. Lungs/Pleura: The central airways are patent. Mild centrilobular emphysema. No focal consolidation. Unchanged 9 x 7 mm nodule in the basilar right lower lobe (5:134), likely benign. No specific follow-up imaging recommended. No  pneumothorax. No pleural effusion. Upper abdomen: Normal. Musculoskeletal: Minimally displaced fracture of the left lateral eighth rib Review of the MIP images confirms the above findings. IMPRESSION: 1. No evidence of pulmonary embolism. 2. Minimally displaced fracture of the left lateral eighth rib. 3. Unchanged ascending thoracic aortic aneurysm measures 4.3 cm. Recommend annual imaging followup by CTA or MRA. This recommendation follows 2010 ACCF/AHA/AATS/ACR/ASA/SCA/SCAI/SIR/STS/SVM Guidelines for the Diagnosis and Management of Patients with Thoracic Aortic Disease. Circulation. 2010; 121: Z733-z630. Aortic aneurysm NOS (ICD10-I71.9) 4.  Emphysema (ICD10-J43.9). Electronically Signed   By: Limin  Xu M.D.   On: 01/06/2024 09:51   DG Chest Portable 1 View Result Date: 01/06/2024 EXAM: 1 VIEW XRAY OF THE CHEST 01/06/2024 08:23:00 AM COMPARISON: PA radiograph of the chest dated 06/02/2022. CLINICAL HISTORY: Chest pain. FINDINGS: LUNGS AND PLEURA: No focal pulmonary opacity. No pulmonary edema. No pleural effusion. No pneumothorax. HEART AND MEDIASTINUM: No acute abnormality of the cardiac and mediastinal silhouettes. BONES AND  SOFT TISSUES: No acute osseous abnormality. IMPRESSION: 1. No acute cardiopulmonary pathology. Electronically signed by: Evalene Coho MD 01/06/2024 08:31 AM EDT RP Workstation: [id number]     Procedures   Medications Ordered in the ED  ondansetron  (ZOFRAN ) injection 4 mg (4 mg Intravenous Given 01/06/24 0804)  famotidine  (PEPCID ) IVPB 20 mg premix (0 mg Intravenous Stopped 01/06/24 1151)  lactated ringers  bolus 1,000 mL (0 mLs Intravenous Stopped 01/06/24 1151)  morphine  (PF) 4 MG/ML injection 4 mg (4 mg Intravenous Given 01/06/24 0804)  iohexol  (OMNIPAQUE ) 350 MG/ML injection 75 mL (75 mLs Intravenous Contrast Given 01/06/24 0925)  magnesium  oxide (MAG-OX) tablet 400 mg (400 mg Oral Given 01/06/24 1321)  alum & mag hydroxide-simeth (MAALOX/MYLANTA) 200-200-20 MG/5ML suspension 30 mL  (30 mLs Oral Given 01/06/24 1321)    And  lidocaine  (XYLOCAINE ) 2 % viscous mouth solution 15 mL (15 mLs Oral Given 01/06/24 1321)  fentaNYL  (SUBLIMAZE ) injection 50 mcg (50 mcg Intravenous Given 01/06/24 1321)    Clinical Course as of 01/06/24 1700  Sun Jan 06, 2024  0838 D-Dimer, Quant(!): 0.75 [JL]  1611 Assumed care from Dr Jerrol. Hx of etoh gastritis. Pw n/v. Has incidental rib fracture. Ct otherwise normal. Awaiting PO challenge.  [RP]    Clinical Course User Index [JL] Jerrol Agent, MD [RP] Yolande Lamar BROCKS, MD                                 Medical Decision Making Amount and/or Complexity of Data Reviewed Labs: ordered. Decision-making details documented in ED Course. Radiology: ordered.  Risk OTC drugs. Prescription drug management.    58 year old male with medical history significant for alcohol dependence, hypertension, thoracic aortic aneurysm (4cm in 2021) who presents to the emergency ferment with left-sided chest pain.  The patient states that he drinks around 10 airplane bottles a day.  He states that yesterday evening he developed left upper quadrant pain that is sharp and worse with deep breathing.  He endorses nausea and vomiting, denies any hematemesis or coffee-ground emesis.  He states that his pain is located in the left upper quadrant and radiates up to the left side of his chest.  He denies any ripping or tearing pain, no radiation to the back.  He denies cough, fever or chills.  He denies any history of DVT or PE.  He is not on any medication for GERD.  On arrival, the patient was vitally stable, afebrile, not tachycardic or tachypneic, BP 156/111, saturating 100% on room air.  On exam the patient had mild left upper quadrant tenderness to palpation, no rebound or guarding.  Lungs CTAB.  Patient presenting with left upper quadrant pain that is sharp, radiating up to the left chest.  Presenting in the setting of chronic daily alcohol use.  My primary concern is  for peptic ulcer disease versus alcoholic gastritis.  Considered PE, ACS, aortic dissection.  Pain is not ripping or tearing, no radiation to the back.  Patient has symmetric pulses bilaterally.  Very low concern for aortic dissection at this time although patient does have a history of 4 cm thoracic aortic aneurysm and therefore this was considered.  Will obtain EKG, chest x-ray, screening laboratory evaluation and reassess.  IV access was obtained and the patient was administered IV morphine , IV Pepcid , and IV fluid bolus and IV Zofran .  EKG: Sinus rhythm, ventricular rate 63, LVH present, no acute ischemic changes. Chest x-ray: IMPRESSION:  1. No acute cardiopulmonary pathology.   Labs: COVID flu and RSV PCR testing negative, magnesium  1.6, replenished orally, lipase normal, CBC with a leukopenia to 3.1, no anemia hemoglobin 13.1, D-dimer elevated at 0.75, cardiac troponins negative, CMP with a mildly low bicarb of 21, otherwise unremarkable, anion gap normal.  Repeat troponin 9.  The setting of elevated D-dimer, CTA PE study ordered: IMPRESSION:  1. No evidence of pulmonary embolism.  2. Minimally displaced fracture of the left lateral eighth rib.  3. Unchanged ascending thoracic aortic aneurysm measures 4.3 cm.  Recommend annual imaging followup by CTA or MRA. This recommendation  follows 2010 ACCF/AHA/AATS/ACR/ASA/SCA/SCAI/SIR/STS/SVM Guidelines  for the Diagnosis and Management of Patients with Thoracic Aortic  Disease. Circulation. 2010; 121: Z733-z630. Aortic aneurysm NOS  (ICD10-I71.9)  4.  Emphysema (ICD10-J43.9).    Informed of the diagnosis of left lateral eighth rib fracture.  Thoracic aorta appears to be 4.3 cm, reviewed in contrast does not appear to fully evaluate for aortic dissection.  On repeat evaluation, the patient endorsing severe pain, now radiating to his back and is now hypertensive, BP 169/122.  In the setting of this, CTA dissection study ordered.  CTA  Dissection: IMPRESSION:  Minimally displaced left eighth rib fracture is again noted.    No evidence of thoracic or abdominal aortic dissection.    4.2 cm ascending thoracic aortic aneurysm. Recommend annual imaging  followup by CTA or MRA. This recommendation follows 2010  ACCF/AHA/AATS/ACR/ASA/SCA/SCAI/SIR/STS/SVM Guidelines for the  Diagnosis and Management of Patients with Thoracic Aortic Disease.  Circulation. 2010; 121: [id number]. Aortic aneurysm NOS  (ICD10-I71.9).   Patient with isolated rib fracture for which opiates were prescribed, overall well-appearing, tolerating p.o. intake on repeat assessment, will discharge on Pepcid  and Protonix  as suspect component of alcoholic gastritis as a contributing factor to the patient's presentation.  Patient advised to follow closely with a primary care provider.  Endorsed concerns regarding transportation at discharge for which social work was consulted.  Patient stable for discharge.     Final diagnoses:  Acute gastritis, presence of bleeding unspecified, unspecified gastritis type  Hypomagnesemia  Closed fracture of one rib of left side, initial encounter    ED Discharge Orders     None          Jerrol Agent, MD 01/06/24 1701

## 2024-01-06 NOTE — ED Triage Notes (Signed)
 Pt bib [facility] from home for abd pain. Pt reports LUQ pain that started yesterday evening. Pt reports N/V. Pain is reported upon palpation. Pt was seen a few months ago for vomiting blood.    EMS VS 162/92 76 hr  97% RA  CBG 105  Hx: HTN , alcohol (drinks approx 10 airplane bottles a day)

## 2024-01-06 NOTE — ED Notes (Signed)
 Provided apple sauce and water. Pt tolerating well. Slightly nauseas but not vomiting,

## 2024-01-06 NOTE — Discharge Instructions (Addendum)
 Your workup was overall reassuring, did show evidence of a rib fracture on the left side for which we have prescribed a short course of pain medication.  Your symptoms are also consistent with alcoholic gastritis for which we will discharge you on a course of Pepcid  and Protonix .  Please follow-up with a primary care provider for continued outpatient management.

## 2024-01-06 NOTE — ED Notes (Signed)
 Pt on bedpan.
